# Patient Record
Sex: Female | Born: 1960 | Race: White | Hispanic: No | Marital: Married | Smoking: Never smoker
Health system: Southern US, Community
[De-identification: ages and names within clinical notes are randomized; demographics above are authoritative.]

## PROBLEM LIST (undated history)

## (undated) DIAGNOSIS — E669 Obesity, unspecified: Secondary | ICD-10-CM

## (undated) DIAGNOSIS — N809 Endometriosis, unspecified: Secondary | ICD-10-CM

## (undated) DIAGNOSIS — C50312 Malignant neoplasm of lower-inner quadrant of left female breast: Secondary | ICD-10-CM

## (undated) DIAGNOSIS — M79673 Pain in unspecified foot: Secondary | ICD-10-CM

## (undated) DIAGNOSIS — D649 Anemia, unspecified: Secondary | ICD-10-CM

## (undated) DIAGNOSIS — D219 Benign neoplasm of connective and other soft tissue, unspecified: Secondary | ICD-10-CM

## (undated) DIAGNOSIS — N979 Female infertility, unspecified: Secondary | ICD-10-CM

## (undated) DIAGNOSIS — Z923 Personal history of irradiation: Secondary | ICD-10-CM

## (undated) HISTORY — DX: Malignant neoplasm of lower-inner quadrant of left female breast: C50.312

## (undated) HISTORY — PX: ABDOMINAL HYSTERECTOMY: SHX81

## (undated) HISTORY — DX: Benign neoplasm of connective and other soft tissue, unspecified: D21.9

## (undated) HISTORY — PX: TONSILLECTOMY: SUR1361

## (undated) HISTORY — DX: Endometriosis, unspecified: N80.9

## (undated) HISTORY — DX: Female infertility, unspecified: N97.9

## (undated) HISTORY — PX: OTHER SURGICAL HISTORY: SHX169

## (undated) HISTORY — DX: Obesity, unspecified: E66.9

## (undated) HISTORY — PX: BREAST BIOPSY: SHX20

## (undated) HISTORY — PX: DILATION AND CURETTAGE OF UTERUS: SHX78

## (undated) HISTORY — DX: Pain in unspecified foot: M79.673

---

## 2014-04-29 ENCOUNTER — Other Ambulatory Visit (INDEPENDENT_AMBULATORY_CARE_PROVIDER_SITE_OTHER): Payer: BLUE CROSS/BLUE SHIELD

## 2014-04-29 ENCOUNTER — Ambulatory Visit (INDEPENDENT_AMBULATORY_CARE_PROVIDER_SITE_OTHER): Payer: BLUE CROSS/BLUE SHIELD | Admitting: Family Medicine

## 2014-04-29 ENCOUNTER — Encounter: Payer: Self-pay | Admitting: Family Medicine

## 2014-04-29 VITALS — BP 120/76 | HR 86 | Ht 66.0 in | Wt 180.0 lb

## 2014-04-29 DIAGNOSIS — M79671 Pain in right foot: Secondary | ICD-10-CM

## 2014-04-29 DIAGNOSIS — M779 Enthesopathy, unspecified: Secondary | ICD-10-CM

## 2014-04-29 DIAGNOSIS — M79672 Pain in left foot: Secondary | ICD-10-CM

## 2014-04-29 DIAGNOSIS — M7742 Metatarsalgia, left foot: Secondary | ICD-10-CM

## 2014-04-29 DIAGNOSIS — M7741 Metatarsalgia, right foot: Secondary | ICD-10-CM

## 2014-04-29 MED ORDER — PREDNISONE 50 MG PO TABS: 50.0000 mg | ORAL_TABLET | Freq: Every day | ORAL | Status: DC

## 2014-04-29 MED ORDER — HYDROXYZINE HCL 25 MG PO TABS: 25.0000 mg | ORAL_TABLET | Freq: Three times a day (TID) | ORAL | Status: DC | PRN

## 2014-04-29 NOTE — Patient Instructions (Addendum)
Very nice to meet you Ice bath 20 minutes at night.  Exercises 3 times a week.  Prednisone 50mg  5 days  Hydroxyzine to help you sleep Vitamin D 2000 IU daily.  Good shoes with rigid bottom.  Jalene Mullet, [place]rell or New balance greater then 700 See me again in 2 weeks.

## 2014-04-29 NOTE — Assessment & Plan Note (Signed)
I do believe with patient packing and moving recently she been on her feet a lot more often. I do think the patient has more of a capsulitis in the metatarsalgia occurring. We discussed proper shoe choices, icing protocol, and patient was given prednisone to take daily for the next 5 days. In addition of this we discussed other over-the-counter medications a could be beneficial. Patient did work with the Lobbyist today to learn home exercises in greater detail. Patient will try to do these and come back and see me again in 3-4 weeks. If patient continues to have difficulty we may need to consider injection. Patient may be a candidate for custom orthotics in the long run.

## 2014-04-29 NOTE — Progress Notes (Signed)
  [facility] [address], [place] [address] Phone: [phone] Subjective:     CC: Pain in feet.   [id number] Bethany Gardner is a 54 y.o. female coming in with complaint of pain in feet.  She states that she has had this recently over the course last several weeks. Patient has recently moved here and had been doing a significant amount of packing.   Patient states that since then when she stands on her toes this seems to be more painful. States that most the pain seems to be at the toes. Does have some soreness of the heel as well. Patient states that sometimes the first at the morning can be more painful. States that though activity can be painful as well. Patient hasn't tried some meloxicam which has been helpful. Has not done any other home modalities. Does not remember any true injury. Rates the severity of pain a 6 out of 10.    Past medical history, social, surgical and family history all reviewed in electronic medical record.   Review of Systems: No headache, visual changes, nausea, vomiting, diarrhea, constipation, dizziness, abdominal pain, skin rash, fevers, chills, night sweats, weight loss, swollen lymph nodes, body aches, joint swelling, muscle aches, chest pain, shortness of breath, mood changes.   Objective Blood pressure 120/76, pulse 86, height 5\' 6"  (1.676 m), weight 180 lb (81.647 kg), SpO2 97 %.  General: No apparent distress alert and oriented x3 mood and affect normal, dressed appropriately.  HEENT: Pupils equal, extraocular movements intact  Respiratory: Patient's speak in full sentences and does not appear short of breath  Cardiovascular: No lower extremity edema, non tender, no erythema  Skin: Warm dry intact with no signs of infection or rash on extremities or on axial skeleton.  Abdomen: Soft nontender  Neuro: Cranial nerves II through XII are intact, neurovascularly intact in all extremities with 2+ DTRs and 2+ pulses.    Lymph: No lymphadenopathy of posterior or anterior cervical chain or axillae bilaterally.  Gait normal with good balance and coordination.  MSK:  Non tender with full range of motion and good stability and symmetric strength and tone of shoulders, elbows, wrist, hip, knee and ankles bilaterally.  Foot exam shows the patient does have bilateral breakdown of the transverse arch bilaterally right greater than left. Patient is more tender to palpation over the right foot mostly over the first MTP joint. Patient otherwise has a fairly neutral foot with a very mild pes cavus bilaterally.  Limited musculoskeletal ultrasound was performed and interpreted by Hulan Saas, M  Limited ultrasound shows the patient does have what ears to be hypoechoic changes of multiple metatarsal joints across the right foot severely and somewhat on the left foot. No significant bony abnormality noted. No signs of a stress fracture. Mild hallux limitus with mild osteophytic changes. Plantar fascia measures 0.95 cm on the right foot. Impression: Capsulitis and metatarsalgia  Procedure note 82800; 15 minutes spent for Therapeutic exercises as stated in above notes.  This included exercises focusing on stretching, strengthening, with significant focus on eccentric aspects.   Proper technique shown and discussed handout in great detail with ATC.  All questions were discussed and answered.     Impression and Recommendations:     This case required medical decision making of moderate complexity.

## 2014-06-25 ENCOUNTER — Ambulatory Visit: Payer: Self-pay | Admitting: Family Medicine

## 2014-09-01 ENCOUNTER — Ambulatory Visit (INDEPENDENT_AMBULATORY_CARE_PROVIDER_SITE_OTHER): Payer: BLUE CROSS/BLUE SHIELD

## 2014-09-01 ENCOUNTER — Ambulatory Visit (INDEPENDENT_AMBULATORY_CARE_PROVIDER_SITE_OTHER): Payer: BLUE CROSS/BLUE SHIELD | Admitting: Podiatry

## 2014-09-01 ENCOUNTER — Encounter: Payer: Self-pay | Admitting: Podiatry

## 2014-09-01 VITALS — BP 120/70 | HR 70 | Resp 11 | Ht 66.0 in | Wt 185.0 lb

## 2014-09-01 DIAGNOSIS — M79671 Pain in right foot: Secondary | ICD-10-CM | POA: Diagnosis not present

## 2014-09-01 DIAGNOSIS — M779 Enthesopathy, unspecified: Secondary | ICD-10-CM

## 2014-09-01 DIAGNOSIS — M79672 Pain in left foot: Secondary | ICD-10-CM | POA: Diagnosis not present

## 2014-09-01 DIAGNOSIS — M722 Plantar fascial fibromatosis: Secondary | ICD-10-CM | POA: Diagnosis not present

## 2014-09-01 MED ORDER — MELOXICAM 15 MG PO TABS: 15.0000 mg | ORAL_TABLET | Freq: Every day | ORAL | Status: DC

## 2014-09-01 NOTE — Progress Notes (Signed)
   Subjective:    Patient ID: Bethany Gardner, female    DOB: 06/27/1960, 54 y.o.   MRN: [id number]  HPI    Review of Systems  Constitutional: Positive for activity change and unexpected weight change.       Inactivity due to foot pain  All other systems reviewed and are negative.      Objective:   Physical Exam        Assessment & Plan:

## 2014-09-01 NOTE — Progress Notes (Signed)
Subjective:     Patient ID: Bethany Gardner, female   DOB: Jan 20, 1961, 54 y.o.   MRN: [id number]  HPI patient presents stating I have had this excruciating pain in my feet for the last 6 months. I was moving from a home in [place] to hear and I was on my feet a lot between the 2 areas and the pain started in his never gotten better. I try to reduce activity without success and I have tried to lose weight   Review of Systems  All other systems reviewed and are negative.      Objective:   Physical Exam  Constitutional: She is oriented to person, place, and time.  Cardiovascular: Intact distal pulses.   Musculoskeletal: Normal range of motion.  Neurological: She is oriented to person, place, and time.  Skin: Skin is warm.  Nursing note and vitals reviewed.  neurovascular status intact with muscle strength adequate range of motion within normal limits. Patient's noted to have moderate discomfort in the metatarsophalangeal joints of both feet within the arch and the plantar fascia but no specific area of intense discomfort. She states it does tend to be worse at the end of the day after she has been her on her foot. 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 I dispensed metatarsal pads with instructions on usage and night splints in order to stretch along with aggressive heat and ice therapy. I placed on meloxicam 15 mg daily and we'll reevaluate again in 2 weeks. Discussed long-term she will require orthotics

## 2014-09-10 ENCOUNTER — Encounter: Payer: Self-pay | Admitting: Family Medicine

## 2014-09-10 ENCOUNTER — Ambulatory Visit (INDEPENDENT_AMBULATORY_CARE_PROVIDER_SITE_OTHER): Payer: BLUE CROSS/BLUE SHIELD | Admitting: Family Medicine

## 2014-09-10 VITALS — BP 102/80 | HR 78 | Temp 97.7°F | Ht 65.0 in | Wt 194.8 lb

## 2014-09-10 DIAGNOSIS — M79673 Pain in unspecified foot: Secondary | ICD-10-CM | POA: Diagnosis not present

## 2014-09-10 DIAGNOSIS — Z6832 Body mass index (BMI) 32.0-32.9, adult: Secondary | ICD-10-CM

## 2014-09-10 DIAGNOSIS — Z7689 Persons encountering health services in other specified circumstances: Secondary | ICD-10-CM

## 2014-09-10 DIAGNOSIS — Z7189 Other specified counseling: Secondary | ICD-10-CM | POA: Diagnosis not present

## 2014-09-10 DIAGNOSIS — Z1211 Encounter for screening for malignant neoplasm of colon: Secondary | ICD-10-CM

## 2014-09-10 NOTE — Progress Notes (Signed)
HPI:  Bethany Gardner is here to establish care. Recently moved from [place]. She wants to schedule her mammogram, hx of benign breast lump with biopsy > 10 years ago. S/p hysterectomy, reports still has ovaries. Reports has mild Hyperlipidemia but her ratio was good. Labs done 1.5 years ago. She wants a referral for a colonoscopy - never did this. No sig FH. No change in bowels  Has the following chronic problems that require follow up and concerns today:  Foot pain: -bilat, started after moving in January 2016 -seeing sports medicine and podiatry -taking meloxicam - and this seems to be helping  Obesity: -gained weight over the last few years; just started back on exercising -diet: ok, but not great - did loose weight with a low carb diet in the pas-reports has had labs in last year and all ok including TSH   ROS negative for unless reported above: fevers, unintentional weight loss, hearing or vision loss, chest pain, palpitations, struggling to breath, hemoptysis, melena, hematochezia, hematuria, falls, loc, si, thoughts of self harm  Past Medical History  Diagnosis Date  . Foot pain   . Obesity     Past Surgical History  Procedure Laterality Date  . Abdominal hysterectomy    . Laproscopy      three  . Tonsillectomy    . Breast biopsy      benign    Family History  Problem Relation Age of Onset  . Heart disease Father   . Heart attack Father 33  . Transient ischemic attack Mother   . Breast cancer Maternal Grandmother     History   Social History  . Marital Status: Married    Spouse Name: N/A  . Number of Children: N/A  . Years of Education: N/A   Social History Main Topics  . Smoking status: Never Smoker   . Smokeless tobacco: Not on file  . Alcohol Use: 0.0 oz/week    0 Standard drinks or equivalent per week     Comment: 1 glass of wine nightly  . Drug Use: Not on file  . Sexual Activity: Not on file   Other Topics Concern  . None   Social History  Narrative   Work or School: homemaker      Home Situation: lives with husband and son in [place] (also has a daughter)      Spiritual Beliefs:  Christian      Lifestyle: restarting a healthy diet and exercise in 2016           Current outpatient prescriptions:  .  meloxicam (MOBIC) 15 MG tablet, Take 1 tablet (15 mg total) by mouth daily., Disp: 30 tablet, Rfl: 2  EXAM:  Filed Vitals:   09/10/14 0815  BP: 102/80  Pulse: 78  Temp: 97.7 F (36.5 C)    Body mass index is 32.42 kg/(m^2).  GENERAL: vitals reviewed and listed above, alert, oriented, appears well hydrated and in no acute distress  HEENT: atraumatic, conjunttiva clear, no obvious abnormalities on inspection of external nose and ears  NECK: no obvious masses on inspection  LUNGS: clear to auscultation bilaterally, no wheezes, rales or rhonchi, good air movement  CV: HRRR, no peripheral edema  MS: moves all extremities without noticeable abnormality  PSYCH: pleasant and cooperative, no obvious depression or anxiety  ASSESSMENT AND PLAN:  Discussed the following assessment and plan:  Foot pain, unspecified laterality  BMI 32.0-32.9,adult  Colon cancer screening - Plan: Ambulatory referral to Gastroenterology  Encounter to  establish care -We reviewed the PMH, PSH, FH, SH, Meds and Allergies. -We provided refills for any medications we will prescribe as needed. -We addressed current concerns per orders and patient instructions. -We have asked for records for pertinent exams, studies, vaccines and notes from previous providers. -We have advised patient to follow up per instructions below. -lifestyle recs- advised of lifestyle, pharmacological and surgical options for weight loss -discussed risks benefits of nsaids, foot wear, non weight bearing options for exercise -mammo -referred for colonoscopy -physical with labs in 3 months   -Patient advised to return or notify a doctor immediately if  symptoms worsen or persist or new concerns arise.  Patient Instructions  BEFORE YOU LEAVE: -schedule physical in 3-4 months, come fasting  -schedule your mammogram  -We placed a referral for you as discussed for the colonoscopy. It usually takes about 1-2 weeks to process and schedule this referral. If you have not heard from [place] regarding this appointment in 2 weeks please contact our office.  We recommend the following healthy lifestyle measures: - eat a healthy diet consisting of lots of vegetables, fruits, beans, nuts, seeds, healthy meats such as white chicken and fish.  - avoid fried foods, starches, sweet, fast food, processed foods, sodas, red meet and other fattening foods.  - get a least 150 minutes of aerobic exercise per week.        Colin Benton R.

## 2014-09-10 NOTE — Patient Instructions (Signed)
BEFORE YOU LEAVE: -schedule physical in 3-4 months, come fasting  -schedule your mammogram  -We placed a referral for you as discussed for the colonoscopy. It usually takes about 1-2 weeks to process and schedule this referral. If you have not heard from [place] regarding this appointment in 2 weeks please contact our office.  We recommend the following healthy lifestyle measures: - eat a healthy diet consisting of lots of vegetables, fruits, beans, nuts, seeds, healthy meats such as white chicken and fish.  - avoid fried foods, starches, sweet, fast food, processed foods, sodas, red meet and other fattening foods.  - get a least 150 minutes of aerobic exercise per week.

## 2014-09-10 NOTE — Progress Notes (Signed)
Pre visit review using our clinic review tool, if applicable. No additional management support is needed unless otherwise documented below in the visit note. 

## 2014-09-15 ENCOUNTER — Ambulatory Visit: Payer: BLUE CROSS/BLUE SHIELD | Admitting: Podiatry

## 2014-10-10 ENCOUNTER — Encounter: Payer: Self-pay | Admitting: Internal Medicine

## 2014-10-14 ENCOUNTER — Other Ambulatory Visit: Payer: Self-pay

## 2014-10-14 DIAGNOSIS — Z1231 Encounter for screening mammogram for malignant neoplasm of breast: Secondary | ICD-10-CM

## 2014-10-28 ENCOUNTER — Ambulatory Visit (INDEPENDENT_AMBULATORY_CARE_PROVIDER_SITE_OTHER): Payer: Self-pay | Admitting: Family Medicine

## 2014-10-28 DIAGNOSIS — R69 Illness, unspecified: Secondary | ICD-10-CM

## 2014-10-28 NOTE — Progress Notes (Signed)
No show

## 2014-11-14 ENCOUNTER — Ambulatory Visit
Admission: RE | Admit: 2014-11-14 | Discharge: 2014-11-14 | Disposition: A | Discharge status: Home / Self Care | Payer: BLUE CROSS/BLUE SHIELD | Source: Ambulatory Visit

## 2014-11-14 DIAGNOSIS — Z1231 Encounter for screening mammogram for malignant neoplasm of breast: Secondary | ICD-10-CM

## 2015-01-05 ENCOUNTER — Encounter: Payer: Self-pay | Admitting: Family Medicine

## 2015-01-05 ENCOUNTER — Ambulatory Visit (INDEPENDENT_AMBULATORY_CARE_PROVIDER_SITE_OTHER): Payer: BLUE CROSS/BLUE SHIELD | Admitting: Family Medicine

## 2015-01-05 VITALS — BP 102/78 | HR 83 | Temp 98.0°F | Ht 65.0 in

## 2015-01-05 DIAGNOSIS — Z808 Family history of malignant neoplasm of other organs or systems: Secondary | ICD-10-CM

## 2015-01-05 DIAGNOSIS — R102 Pelvic and perineal pain unspecified side: Secondary | ICD-10-CM

## 2015-01-05 DIAGNOSIS — Z23 Encounter for immunization: Secondary | ICD-10-CM

## 2015-01-05 DIAGNOSIS — M79673 Pain in unspecified foot: Secondary | ICD-10-CM

## 2015-01-05 DIAGNOSIS — H8110 Benign paroxysmal vertigo, unspecified ear: Secondary | ICD-10-CM | POA: Diagnosis not present

## 2015-01-05 DIAGNOSIS — Z1211 Encounter for screening for malignant neoplasm of colon: Secondary | ICD-10-CM

## 2015-01-05 DIAGNOSIS — Z0000 Encounter for general adult medical examination without abnormal findings: Secondary | ICD-10-CM

## 2015-01-05 DIAGNOSIS — M549 Dorsalgia, unspecified: Secondary | ICD-10-CM | POA: Diagnosis not present

## 2015-01-05 LAB — LIPID PANEL
CHOL/HDL RATIO: 3
Cholesterol: 218 mg/dL — ABNORMAL HIGH (ref 0–200)
HDL: 68 mg/dL (ref >39.00)
LDL CALC: 133 mg/dL — AB (ref 0–99)
NONHDL: 149.83
TRIGLYCERIDES: 84 mg/dL (ref 0.0–149.0)
VLDL: 16.8 mg/dL (ref 0.0–40.0)

## 2015-01-05 LAB — HEMOGLOBIN A1C: Hgb A1c MFr Bld: 5.3 % (ref 4.6–6.5)

## 2015-01-05 LAB — TSH: TSH: 1.54 u[IU]/mL (ref 0.35–4.50)

## 2015-01-05 MED ORDER — MECLIZINE HCL 25 MG PO TABS: 25.0000 mg | ORAL_TABLET | Freq: Three times a day (TID) | ORAL | Status: DC | PRN

## 2015-01-05 NOTE — Progress Notes (Signed)
Patient declines weight measurement today. 

## 2015-01-05 NOTE — Progress Notes (Signed)
Pre visit review using our clinic review tool, if applicable. No additional management support is needed unless otherwise documented below in the visit note. 

## 2015-01-05 NOTE — Progress Notes (Signed)
HPI:  Here for CPE:  -Concerns and/or follow up today: many  1)low back pelvic pain: -about once per year for > five years -moderate to severe low back and pelvic pain lasting 1-2 weeks then resolves on its own -denies: weakness, numbness, nvd, change in bowels, bowel or bladder dysfunction, weight loss -reports eval with gyn several times remotely as she is very concerned about her ovaries and told normal, wants to see gyn here  2)chronic foot pain: -seeing podiatry and has seen several and is not happy with results -thinks has diabetic neuropathy -denies: weakness, numbness, worsening  3)Intermittnet chronic vertigo: -reports eval remotely and dx positional vertigo -flares triggered by position, certain turning of the head -wants meclizine which she has used in the past -reports never did vestibular rehab  4)Obesity: -Diet: variety of foods, balance and well rounded, larger portion sizes -Exercise: no regular exercise  -Taking folic acid, vitamin D or calcium: no  -Diabetes and Dyslipidemia Screening: FASTING today  -Hx of HTN: no  -Vaccines: agreed to flu, declined tdap  -pap history: s/p hysterectomy  -FDLMP: n/a  -sexual activity: yes, female partner, no new partners  -wants STI testing (Hep C if born [birth year]): no  -FH breast, colon or ovarian ca: see FH Last mammogram:done this year Last colon cancer screening: not done  She wants to see gyn for breast health   -Alcohol, Tobacco, drug use: see social history  Review of Systems - no fevers, unintentional weight loss, vision loss, hearing loss, chest pain, sob, hemoptysis, melena, hematochezia, hematuria, genital discharge, changing or concerning skin lesions, bleeding, bruising, loc, thoughts of self harm or SI  Past Medical History  Diagnosis Date  . Foot pain   . Obesity     Past Surgical History  Procedure Laterality Date  . Abdominal hysterectomy    . Laproscopy      three  . Tonsillectomy     . Breast biopsy      benign    Family History  Problem Relation Age of Onset  . Heart disease Father   . Heart attack Father 7  . Transient ischemic attack Mother   . Breast cancer Maternal Grandmother     Social History   Social History  . Marital Status: Married    Spouse Name: N/A  . Number of Children: N/A  . Years of Education: N/A   Social History Main Topics  . Smoking status: Never Smoker   . Smokeless tobacco: None  . Alcohol Use: 0.0 oz/week    0 Standard drinks or equivalent per week     Comment: 1 glass of wine nightly  . Drug Use: None  . Sexual Activity: Not Asked   Other Topics Concern  . None   Social History Narrative   Work or School: homemaker      Home Situation: lives with husband and son in [place] (also has a daughter)      Spiritual Beliefs:  Christian      Lifestyle: restarting a healthy diet and exercise in 2016           Current outpatient prescriptions:  .  meloxicam (MOBIC) 15 MG tablet, Take 1 tablet (15 mg total) by mouth daily., Disp: 30 tablet, Rfl: 2 .  meclizine (ANTIVERT) 25 MG tablet, Take 1 tablet (25 mg total) by mouth 3 (three) times daily as needed for dizziness., Disp: 30 tablet, Rfl: 0  EXAM:  Filed Vitals:   01/05/15 0935  BP: 102/78  Pulse: 83  Temp: 98 F (36.7 C)    GENERAL: vitals reviewed and listed below, alert, oriented, appears well hydrated and in no acute distress  HEENT: head atraumatic, PERRLA, normal appearance of eyes, ears, nose and mouth. moist mucus membranes.  NECK: supple, no masses or lymphadenopathy  LUNGS: clear to auscultation bilaterally, no rales, rhonchi or wheeze  CV: HRRR, no peripheral edema or cyanosis, normal pedal pulses  BREAST: declined  ABDOMEN: bowel sounds normal, soft, non tender to palpation, no masses, no rebound or guarding  GU: declined  SKIN: no rash or abnormal lesions  MS: normal gait, moves all extremities normally  NEURO: CN II-XII grossly  intact, normal muscle strength and sensation to light touch on extremities  PSYCH: normal affect, pleasant and cooperative  ASSESSMENT AND PLAN:  Discussed the following assessment and plan: NOTE: sig time, work and evaluation beyond normal physical for multiple new complaints to me and chronic issues with new labs, referrals and medications prescribed.  Visit for preventive health examination - Plan: Lipid Panel, Hemoglobin A1c, TSH -Discussed and advised all [place] preventive services health task force level A and B recommendations for age, sex and risks. -offered pelvic and breast exam after discussion limitations - deferred - she plans to see gyn -flu vaccine given -Advised at least 150 minutes of exercise per week and a healthy diet low in saturated fats and sweets and consisting of fresh fruits and vegetables, lean meats such as fish and white chicken and whole grains. -FASTING labs, studies and vaccines per orders this encounter  Bilateral back pain, unspecified location Pelvic pain in female -infrequent, rare occurences for > 5 years - unlikely serious or neoplastic process -we discussed possible serious and likely etiologies, workup and treatment, treatment risks and return precautions - DDD with radicular symptoms vs IBS most likely -after this discussion, Bethany Gardner opted for colonoscopy, xrays, low back HEP, gyn eval for her concerns regarding this being her ovaries (has had neg eval in the past), I advised CT if persists despite this eval, worsening or persistent concerns and discussed risks -follow up advised as needed -of course, we advised Bethany Gardner  to return or notify a doctor immediately if symptoms worsen or persist or new concerns arise.  Colon cancer screening - Plan: Ambulatory referral to Colorectal Surgery  FH: melanoma -advised at least qy skin check with derm and provided number to call to set up; advised she call today to schedule eval  Benign paroxysmal positional  vertigo, unspecified laterality -likely given hx, discussed other causes of vertigo -refilled meclizine and advised vestibular rehab, advise MRI if persistent or new  Symptoms  Foot pain, unspecified laterality - Plan: Methylmalonic Acid, Serum -advised follow up with her podiatrist, topical treatments as she fails all medical recs have failed her from multiple specialists -check tsh and b12    Orders Placed This Encounter  Procedures  . Lipid Panel  . Hemoglobin A1c  . TSH  . Methylmalonic Acid, Serum  . Ambulatory referral to Colorectal Surgery    Referral Priority:  Routine    Referral Type:  Surgical    Referral Reason:  Specialty Services Required    Requested Specialty:  Gastroenterology    Number of Visits Requested:  1    Patient advised to return to clinic immediately if symptoms worsen or persist or new concerns.  Patient Instructions  BEFORE YOU LEAVE: -labs -pneumonia vaccine -xray sheet -low back exercises  Call today to schedule a yearly dermatology skin exam - numbers provided  We placed a referral for you as discussed for your colonoscopy and to the vestibular rehab therapist for the vertigo. It usually takes about 1-2 weeks to process and schedule this referral. If you have not heard from [place] regarding this appointment in 2 weeks please contact our office.  Please call if persistent vertigo in 1 month after treatment  For the back and abdominal pain: -get the xrays of your back -do the exercises 3x per week -call to schedule appointment with gyn for annual gyn exam -get colonoscopy -follow up as needed  For the feet: -try topical menthol (tiger balm) or other sports creams -follow up with your foot doctor  We recommend the following healthy lifestyle measures: - eat a healthy diet consisting of small portions of vegetables, fruits, beans, nuts, seeds, healthy meats such as white chicken and fish and whole grains.  - avoid sweets, white starches,  fried foods, fast food, processed foods, sodas, red meet and other fattening foods.  - get a least 150 minutes of aerobic exercise per week.        No Follow-up on file.  Colin Benton R.

## 2015-01-05 NOTE — Patient Instructions (Addendum)
BEFORE YOU LEAVE: -labs -pneumonia vaccine -xray sheet -low back exercises -Follow up in 3 months  Call today to schedule a yearly dermatology skin exam - numbers provided  We placed a referral for you as discussed for your colonoscopy and to the vestibular rehab therapist for the vertigo. It usually takes about 1-2 weeks to process and schedule this referral. If you have not heard from [place] regarding this appointment in 2 weeks please contact our office.  Please call if persistent vertigo in 1 month after treatment  For the back and abdominal pain: -get the xrays of your back -do the exercises 3x per week -call to schedule appointment with gyn for annual gyn exam -get colonoscopy -follow up as needed  For the feet: -try topical menthol (tiger balm) or other sports creams -follow up with your foot doctor  We recommend the following healthy lifestyle measures: - eat a healthy diet consisting of small portions of vegetables, fruits, beans, nuts, seeds, healthy meats such as white chicken and fish and whole grains.  - avoid sweets, white starches, fried foods, fast food, processed foods, sodas, red meet and other fattening foods.  - get a least 150 minutes of aerobic exercise per week.

## 2015-01-07 LAB — METHYLMALONIC ACID, SERUM: Methylmalonic Acid, Quant: 131 nmol/L (ref 87–318)

## 2015-01-08 ENCOUNTER — Encounter: Payer: Self-pay | Admitting: Gastroenterology

## 2015-01-26 ENCOUNTER — Ambulatory Visit (AMBULATORY_SURGERY_CENTER): Payer: Self-pay | Admitting: *Deleted

## 2015-01-26 ENCOUNTER — Ambulatory Visit (INDEPENDENT_AMBULATORY_CARE_PROVIDER_SITE_OTHER)
Admission: RE | Admit: 2015-01-26 | Discharge: 2015-01-26 | Disposition: A | Discharge status: Home / Self Care | Payer: BLUE CROSS/BLUE SHIELD | Source: Ambulatory Visit | Attending: Family Medicine | Admitting: Family Medicine

## 2015-01-26 ENCOUNTER — Other Ambulatory Visit: Payer: Self-pay | Admitting: *Deleted

## 2015-01-26 VITALS — Ht 66.0 in | Wt 200.0 lb

## 2015-01-26 DIAGNOSIS — M545 Low back pain: Secondary | ICD-10-CM

## 2015-01-26 DIAGNOSIS — Z1211 Encounter for screening for malignant neoplasm of colon: Secondary | ICD-10-CM

## 2015-01-26 MED ORDER — SUPREP BOWEL PREP KIT 17.5-3.13-1.6 GM/177ML PO SOLN: 1.0000 | Freq: Once | ORAL | Status: DC

## 2015-01-26 NOTE — Progress Notes (Signed)
Patient denies any allergies to egg or soy products. Patient denies complications with anesthesia/sedation.  Patient denies oxygen use at home and denies diet medications. Emmi instructions for colonoscopy explained and denied.

## 2015-02-05 ENCOUNTER — Encounter: Payer: Self-pay | Admitting: Gastroenterology

## 2015-02-05 ENCOUNTER — Ambulatory Visit (AMBULATORY_SURGERY_CENTER): Payer: BLUE CROSS/BLUE SHIELD | Admitting: Gastroenterology

## 2015-02-05 VITALS — BP 97/55 | HR 62 | Temp 98.2°F | Resp 20 | Ht 66.0 in | Wt 200.0 lb

## 2015-02-05 DIAGNOSIS — Z1211 Encounter for screening for malignant neoplasm of colon: Secondary | ICD-10-CM

## 2015-02-05 DIAGNOSIS — K621 Rectal polyp: Secondary | ICD-10-CM

## 2015-02-05 DIAGNOSIS — D128 Benign neoplasm of rectum: Secondary | ICD-10-CM

## 2015-02-05 DIAGNOSIS — D123 Benign neoplasm of transverse colon: Secondary | ICD-10-CM

## 2015-02-05 MED ORDER — SODIUM CHLORIDE 0.9 % IV SOLN: 500.0000 mL | INTRAVENOUS | Status: DC

## 2015-02-05 NOTE — Progress Notes (Signed)
Called to room to assist during endoscopic procedure.  Patient ID and intended procedure confirmed with present staff. Received instructions for my participation in the procedure from the performing physician.  

## 2015-02-05 NOTE — Op Note (Signed)
[place]  [address] [place], [id number]   COLONOSCOPY PROCEDURE REPORT  PATIENT: Bethany Gardner, Bethany Gardner  MR#: [id number] BIRTHDATE: [birth year] , 59  yrs. old GENDER: female ENDOSCOPIST: Yetta Flock, MD REFERRED BY: Colin [place] DO PROCEDURE DATE:  02/05/2015 PROCEDURE:   Colonoscopy, screening, Colonoscopy with snare polypectomy, and Colonoscopy with biopsy First Screening Colonoscopy - Avg.  risk and is 50 yrs.  old or older Yes.  Prior Negative Screening - Now for repeat screening. N/A  History of Adenoma - Now for follow-up colonoscopy & has been > or = to 3 yrs.  N/A  Polyps removed today? Yes ASA CLASS:   Class II INDICATIONS:Screening for colonic neoplasia and Colorectal Neoplasm Risk Assessment for this procedure is average risk. MEDICATIONS: Propofol 300 mg IV  DESCRIPTION OF PROCEDURE:   After the risks benefits and alternatives of the procedure were thoroughly explained, informed consent was obtained.  The digital rectal exam revealed no abnormalities of the rectum.   The LB [id number]  endoscope was introduced through the anus and advanced to the cecum, which was identified by both the appendix and ileocecal valve. No adverse events experienced.   The quality of the prep was good.  The instrument was then slowly withdrawn as the colon was fully examined. Estimated blood loss is zero unless otherwise noted in this procedure report.  COLON FINDINGS: A flat polyp measuring 8 mm in size was found in the transverse colon.  A polypectomy was performed with a cold snare. The resection was complete, the polyp tissue was completely retrieved and sent to histology.   A sessile polyp measuring 3 mm in size was found in the rectum.  A polypectomy was performed with cold forceps.  The resection was complete, the polyp tissue was completely retrieved and sent to histology.   The examination was otherwise normal.  Retroflexed views revealed internal  hemorrhoids. The time to cecum = 3.3 Withdrawal time = 17.1   The scope was withdrawn and the procedure completed. COMPLICATIONS: There were no immediate complications.  ENDOSCOPIC IMPRESSION: 1.   Flat polyp was found in the transverse colon; polypectomy was performed with a cold snare 2.   Sessile polyp was found in the rectum; polypectomy was performed with cold forceps 3.   The examination was otherwise normal  RECOMMENDATIONS: 1.  Await pathology results 2.  No NSAIDs for 2 weeks 3.  Resume diet 4.  Resume medications  eSigned:  Yetta Flock, MD 02/05/2015 2:47 PM   cc: Colin [place] DO, the patient

## 2015-02-05 NOTE — Patient Instructions (Signed)
Impressions/recommendations:  Polyps (handout given)  Do not take aspirin, aspirin containing products or NSAIDS for 2 weeks, may resume 11/11.  Repeat colonoscopy pending pathology results.  YOU HAD AN ENDOSCOPIC PROCEDURE TODAY AT [facility]:   Refer to the procedure report that was given to you for any specific questions about what was found during the examination.  If the procedure report does not answer your questions, please call your gastroenterologist to clarify.  If you requested that your care partner not be given the details of your procedure findings, then the procedure report has been included in a sealed envelope for you to review at your convenience later.  YOU SHOULD EXPECT: Some feelings of bloating in the abdomen. Passage of more gas than usual.  Walking can help get rid of the air that was put into your GI tract during the procedure and reduce the bloating. If you had a lower endoscopy (such as a colonoscopy or flexible sigmoidoscopy) you may notice spotting of blood in your stool or on the toilet paper. If you underwent a bowel prep for your procedure, you may not have a normal bowel movement for a few days.  Please Note:  You might notice some irritation and congestion in your nose or some drainage.  This is from the oxygen used during your procedure.  There is no need for concern and it should clear up in a day or so.  SYMPTOMS TO REPORT IMMEDIATELY:   Following lower endoscopy (colonoscopy or flexible sigmoidoscopy):  Excessive amounts of blood in the stool  Significant tenderness or worsening of abdominal pains  Swelling of the abdomen that is new, acute  Fever of 100F or higher   For urgent or emergent issues, a gastroenterologist can be reached at any hour by calling [phone].   DIET: Your first meal following the procedure should be a small meal and then it is ok to progress to your normal diet. Heavy or fried foods are harder to digest  and may make you feel nauseous or bloated.  Likewise, meals heavy in dairy and vegetables can increase bloating.  Drink plenty of fluids but you should avoid alcoholic beverages for 24 hours.  ACTIVITY:  You should plan to take it easy for the rest of today and you should NOT DRIVE or use heavy machinery until tomorrow (because of the sedation medicines used during the test).    FOLLOW UP: Our staff will call the number listed on your records the next business day following your procedure to check on you and address any questions or concerns that you may have regarding the information given to you following your procedure. If we do not reach you, we will leave a message.  However, if you are feeling well and you are not experiencing any problems, there is no need to return our call.  We will assume that you have returned to your regular daily activities without incident.  If any biopsies were taken you will be contacted by phone or by letter within the next 1-3 weeks.  Please call us at [phone] if you have not heard about the biopsies in 3 weeks.    SIGNATURES/CONFIDENTIALITY: You and/or your care partner have signed paperwork which will be entered into your electronic medical record.  These signatures attest to the fact that that the information above on your After Visit Summary has been reviewed and is understood.  Full responsibility of the confidentiality of this discharge information lies with you  and/or your care-partner. 

## 2015-02-05 NOTE — Progress Notes (Signed)
To Pacu-pt awake and alert. Please with MAC. Report to RN

## 2015-02-05 NOTE — Progress Notes (Signed)
Patient states IV site very tender. Stated it was "thumped" on a lot prior to starting IV. Information given of being a tender spot to start an IV, warm pack placed and encouraged not to hold it stiffly, use it as normal. Asked her to phone [place] if not improved by middle of next week.

## 2015-02-06 ENCOUNTER — Telehealth: Payer: Self-pay | Admitting: *Deleted

## 2015-02-06 NOTE — Telephone Encounter (Signed)
  Follow up Call-  Call back number 02/05/2015  Post procedure Call Back phone  # [phone]  Permission to leave phone message Yes     Patient questions:  Message left to call if necessary.

## 2015-02-11 ENCOUNTER — Encounter: Payer: Self-pay | Admitting: Gastroenterology

## 2015-03-25 ENCOUNTER — Ambulatory Visit: Payer: Self-pay | Admitting: Obstetrics and Gynecology

## 2015-04-15 ENCOUNTER — Telehealth: Payer: Self-pay | Admitting: Obstetrics and Gynecology

## 2015-04-15 ENCOUNTER — Ambulatory Visit (INDEPENDENT_AMBULATORY_CARE_PROVIDER_SITE_OTHER): Payer: Managed Care, Other (non HMO) | Admitting: Obstetrics and Gynecology

## 2015-04-15 ENCOUNTER — Encounter: Payer: Self-pay | Admitting: Obstetrics and Gynecology

## 2015-04-15 VITALS — BP 118/72 | HR 84 | Resp 16 | Ht 65.25 in | Wt 195.0 lb

## 2015-04-15 DIAGNOSIS — Z0000 Encounter for general adult medical examination without abnormal findings: Secondary | ICD-10-CM

## 2015-04-15 DIAGNOSIS — Z01419 Encounter for gynecological examination (general) (routine) without abnormal findings: Secondary | ICD-10-CM

## 2015-04-15 DIAGNOSIS — Z780 Asymptomatic menopausal state: Secondary | ICD-10-CM

## 2015-04-15 DIAGNOSIS — M79672 Pain in left foot: Principal | ICD-10-CM

## 2015-04-15 DIAGNOSIS — N941 Unspecified dyspareunia: Secondary | ICD-10-CM | POA: Diagnosis not present

## 2015-04-15 DIAGNOSIS — M792 Neuralgia and neuritis, unspecified: Secondary | ICD-10-CM

## 2015-04-15 DIAGNOSIS — R1030 Lower abdominal pain, unspecified: Secondary | ICD-10-CM | POA: Diagnosis not present

## 2015-04-15 DIAGNOSIS — N952 Postmenopausal atrophic vaginitis: Secondary | ICD-10-CM

## 2015-04-15 DIAGNOSIS — M79671 Pain in right foot: Secondary | ICD-10-CM

## 2015-04-15 LAB — FOLLICLE STIMULATING HORMONE: FSH: 100.9 m[IU]/mL

## 2015-04-15 MED ORDER — ESTRADIOL 10 MCG VA TABS: ORAL_TABLET | VAGINAL | Status: DC

## 2015-04-15 NOTE — Patient Instructions (Addendum)

## 2015-04-15 NOTE — Telephone Encounter (Signed)
Patient was seen today and is asking for a recommendation to a foot doctor.

## 2015-04-15 NOTE — Progress Notes (Signed)
Patient ID: Bethany Gardner, female   DOB: 16-Dec-1960, 55 y.o.   MRN: [id number] 55 y.o. [id number] MarriedCaucasianF here for annual exam. Patient c/o pelvic and back pain. She is also c/o painful intercourse and vaginal dryness.    She had a laparoscopic hysterectomy 6 years ago, still has her ovaries. H/O endometriosis, menorrhagia and dysmenorrhea. She c/o intermittent severe pelvic pain for the last 5 years. Pain is from her lower abdomen to lower back, it's severe. The pain is sporadic and random. She can go up to 6 months without the pain. Last episode 3 months ago lasted x 2 months. Nothing makes it better or worse. Flaring slightly now. The pain feels vice like.  She denies hot flashes or night sweats. She does have vaginal dryness, dyspareunia. Feels like she is tearing.  She uses rectal suppository every morning to have a BM (fleets), she has been doing this long term. She can't have a BM without it. Normal colonoscopy a month ago.  No urinary c/o. She has terrible foot pain, she has gained a lot of weight. Both feet have needle/burning pain. Normal glucose testing at the pharmacy, had blood work with her primary.     No LMP recorded. Patient has had a hysterectomy.          Sexually active: Yes.    The current method of family planning is status post hysterectomy.    Exercising: No.  The patient does not participate in regular exercise at present. She used to be a marathon runner. Smoker:  no  Health Maintenance: Pap:  unsure History of abnormal Pap:  no MMG:  11-14-14 WNL  Colonoscopy:  02-05-15 polyps repeat in 5 years  BMD:   Never TDaP:  02-2015 Gardasil: N/A   reports that she has never smoked. She has never used smokeless tobacco. She reports that she drinks about 4.2 oz of alcohol per week. She reports that she does not use illicit drugs. She is a homemaker, she has a Equities trader in high school and a grown pregnant daughter (out of town). She is an Training and development officer, h/o Community education officer.   Past  Medical History  Diagnosis Date  . Foot pain     bilateral - occ foot pain  . Obesity   . Endometriosis   . Fibroid   . Infertility, female   1 NSVD, 1 adopted child.  Past Surgical History  Procedure Laterality Date  . Abdominal hysterectomy    . Laproscopy      three  . Tonsillectomy    . Breast biopsy Left     benign  . Dilation and curettage of uterus      Current Outpatient Prescriptions  Medication Sig Dispense Refill  . ibuprofen (ADVIL,MOTRIN) 100 MG tablet Take 200 mg by mouth at bedtime.    . meloxicam (MOBIC) 15 MG tablet Take 1 tablet (15 mg total) by mouth daily. 30 tablet 2   No current facility-administered medications for this visit.    Family History  Problem Relation Age of Onset  . Heart disease Father   . Heart attack Father 66  . Transient ischemic attack Mother   . Stroke Mother   . Breast cancer Maternal Grandmother   . Colon cancer Neg Hx   . Colon polyps Neg Hx   . Esophageal cancer Neg Hx   . Rectal cancer Neg Hx   . Stomach cancer Neg Hx   . Melanoma Sister     Review of Systems  Constitutional: Positive for  unexpected weight change.  HENT: Negative.   Eyes: Negative.   Respiratory: Negative.   Cardiovascular: Negative.   Gastrointestinal: Negative.   Endocrine: Negative.   Genitourinary: Positive for pelvic pain and dyspareunia.       Vaginal dryness  Musculoskeletal: Positive for back pain.  Skin: Negative.   Allergic/Immunologic: Negative.   Neurological: Negative.   Psychiatric/Behavioral: Negative.     Exam:   BP 118/72 mmHg  Pulse 84  Resp 16  Ht 5' 5.25" (1.657 m)  Wt 195 lb (88.451 kg)  BMI 32.21 kg/m2  Weight change: @WEIGHTCHANGE @ Height:   Height: 5' 5.25" (165.7 cm)  Ht Readings from Last 3 Encounters:  04/15/15 5' 5.25" (1.657 m)  02/05/15 5\' 6"  (1.676 m)  01/26/15 5\' 6"  (1.676 m)    General appearance: alert, cooperative and appears stated age Head: Normocephalic, without obvious abnormality,  atraumatic Neck: no adenopathy, supple, symmetrical, trachea midline and thyroid normal to inspection and palpation Lungs: clear to auscultation bilaterally Breasts: normal appearance, no masses or tenderness Heart: regular rate and rhythm Abdomen: soft, non-tender; bowel sounds normal; no masses,  no organomegaly Extremities: extremities normal, atraumatic, no cyanosis or edema Skin: Skin color, texture, turgor normal. No rashes or lesions Lymph nodes: Cervical, supraclavicular, and axillary nodes normal. No abnormal inguinal nodes palpated Neurologic: Grossly normal   Pelvic: External genitalia:  no lesions              Urethra:  normal appearing urethra with no masses, tenderness or lesions              Bartholins and Skenes: normal                 Vagina: mild atrophy, able to insert 2 fingers, uncomfortable              Cervix: no lesions               Bimanual Exam:  Uterus:  uterus absent              Adnexa: no mass, fullness, tenderness               Rectovaginal: Confirms               Anus:  normal sphincter tone, no lesions  Chaperone was present for exam.  A:  Well Woman with normal exam  Abdominal/pelvic pain  Vaginal atrophy  Dyspareunia   Foot pain, sounds like a neuropathy, she has seen podiatry and her primary. Normal HgbA1C   P:   Start vagifem  Return for GYN ultrasound  Refer to Neurology for her foot pain  No pap needed  Dawson, vit D  Get a copy of op note  Start vagifem  Discussed lubrication and her controlling rate and depth of penetration with intercourse

## 2015-04-16 LAB — VITAMIN D 25 HYDROXY (VIT D DEFICIENCY, FRACTURES): VIT D 25 HYDROXY: 40 ng/mL (ref 30–100)

## 2015-04-16 NOTE — Telephone Encounter (Signed)
Spoke with patient. She has seen Dr. Paulla Dolly at [facility]. She is requesting and additional evaluation. She has been reading about neuropathy as Dr. Talbert Nan has mentioned and she feels this may be her problem.  She is agreeable to neurology referral. Advised referral is placed and she will be contacted with referral information and Pelvic ultrasound  Scheduling.  Patient agreeable.  Routing to provider for final review. Patient agreeable to disposition. Will close encounter.   cc Lerry Liner for insurance pre-certification, referral processing and patient contact.

## 2015-04-20 ENCOUNTER — Ambulatory Visit: Payer: Self-pay | Admitting: Neurology

## 2015-04-20 ENCOUNTER — Telehealth: Payer: Self-pay

## 2015-04-20 NOTE — Telephone Encounter (Signed)
Spoke with patient. Results given as seen below. Patient is agreeable and verbalizes understanding. Patient states she is currently taking Vitamin D 4,000 IU daily. Advised with Vitamin D being within the normal range at 40 she may start taking OTC Vitamin D 3 1,000 IU daily. Patient is agreeable. Would like to schedule PUS at this time. Appointment scheduled for 04/28/2015 at 2 pm with 2:30 pm consult with Dr.Jertson. Patient is agreeable to date and time. Order has already been placed for precert.  Cc: Lerry Liner  Routing to provider for final review. Patient agreeable to disposition. Will close encounter.

## 2015-04-20 NOTE — Telephone Encounter (Signed)
-----   Message from Salvadore Dom, MD sent at 04/16/2015  5:20 PM EST ----- Please inform the patient that her [facility] is in the menopausal range and her Vit D is normal.

## 2015-04-21 ENCOUNTER — Telehealth: Payer: Self-pay | Admitting: Obstetrics and Gynecology

## 2015-04-21 ENCOUNTER — Other Ambulatory Visit: Payer: Self-pay

## 2015-04-21 DIAGNOSIS — R102 Pelvic and perineal pain: Secondary | ICD-10-CM

## 2015-04-21 NOTE — Telephone Encounter (Signed)
Spoke with pt regarding benefit for ultrasound. Patient understood and agreeable. Patient ready to schedule. Patient scheduled 04/27/14 with Dr. Talbert Nan. Pt aware of arrival date and time. Pt aware of 72 hours cancellation policy with [id number] fee. No further questions. Ok to close

## 2015-04-22 ENCOUNTER — Other Ambulatory Visit: Payer: Self-pay | Admitting: Obstetrics and Gynecology

## 2015-04-22 DIAGNOSIS — R102 Pelvic and perineal pain: Secondary | ICD-10-CM

## 2015-04-27 ENCOUNTER — Encounter: Payer: Self-pay | Admitting: Neurology

## 2015-04-27 ENCOUNTER — Ambulatory Visit (INDEPENDENT_AMBULATORY_CARE_PROVIDER_SITE_OTHER): Payer: Managed Care, Other (non HMO) | Admitting: Neurology

## 2015-04-27 VITALS — BP 139/85 | HR 81 | Resp 20 | Ht 66.0 in | Wt 196.0 lb

## 2015-04-27 DIAGNOSIS — H8110 Benign paroxysmal vertigo, unspecified ear: Secondary | ICD-10-CM | POA: Diagnosis not present

## 2015-04-27 DIAGNOSIS — R202 Paresthesia of skin: Secondary | ICD-10-CM | POA: Diagnosis not present

## 2015-04-27 MED ORDER — GABAPENTIN 100 MG PO CAPS: 300.0000 mg | ORAL_CAPSULE | Freq: Three times a day (TID) | ORAL | Status: DC

## 2015-04-27 NOTE — Progress Notes (Signed)
PATIENT: Bethany Gardner DOB: 1960/09/18  Chief Complaint  Patient presents with  . Pain    bilateral foot pain, had it for a year, can hardly work, has seen PCP and podiatry, not taking any medications for it, thinks it is like diabetic foot pain     HISTORICAL  Bethany Gardner is a 55 years old right-handed female, seen in refer by her primary care physician Dr. Lucretia Kern for evaluation of bilateral foot pain  In 2016, after all very stressful year, she noticed bilateral feet paresthesia, involving bottom and top of her feet, numbness tingling burning, stabbing pain, constant deep achy pain," as if my feet was run over by a truck", difficult to walk, she has been evaluated by podiatrist, tried thermal treatment, morbidly, without helping her symptoms, she denies significant low back pain, mild unsteady gait due to feet pain, she denies bilateral fingertips paresthesia, she denies bowel and bladder incontinence.  She also complains of vertigo since April 20 2015, it happened when she visual in her bed suddenly to one direction, lasting for a few minutes, no hearing loss, no tinnitus, no visual change, similar symptoms happened in the past.  She also reported 50 pound weight gain over 2 years.  REVIEW OF SYSTEMS: Full 14 system review of systems performed and notable only for as above  ALLERGIES: No Known Allergies  HOME MEDICATIONS: Current Outpatient Prescriptions  Medication Sig Dispense Refill  . ibuprofen (ADVIL,MOTRIN) 100 MG tablet Take 200 mg by mouth at bedtime.    . Estradiol (VAGIFEM) 10 MCG TABS vaginal tablet Place 1 tablet per vagina every night x 1 week, then 2 x a week at hs (Patient not taking: Reported on 04/27/2015) 24 tablet 0   No current facility-administered medications for this visit.    PAST MEDICAL HISTORY: Past Medical History  Diagnosis Date  . Foot pain     bilateral - occ foot pain  . Obesity   . Endometriosis   . Fibroid   . Infertility,  female     PAST SURGICAL HISTORY: Past Surgical History  Procedure Laterality Date  . Abdominal hysterectomy    . Laproscopy      three  . Tonsillectomy    . Breast biopsy Left     benign  . Dilation and curettage of uterus      FAMILY HISTORY: Family History  Problem Relation Age of Onset  . Heart disease Father   . Heart attack Father 16  . Transient ischemic attack Mother   . Stroke Mother   . Breast cancer Maternal Grandmother   . Colon cancer Neg Hx   . Colon polyps Neg Hx   . Esophageal cancer Neg Hx   . Rectal cancer Neg Hx   . Stomach cancer Neg Hx   . Melanoma Sister     SOCIAL HISTORY:  Social History   Social History  . Marital Status: Married    Spouse Name: N/A  . Number of Children: N/A  . Years of Education: N/A   Occupational History  . Not on file.   Social History Main Topics  . Smoking status: Never Smoker   . Smokeless tobacco: Never Used  . Alcohol Use: 4.2 oz/week    4 Glasses of wine, 3 Standard drinks or equivalent per week     Comment: occasionally  . Drug Use: No  . 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  PHYSICAL EXAMNIATION:  Gen: NAD, conversant, well nourised, obese, well groomed                     Cardiovascular: Regular rate rhythm, no peripheral edema, warm, nontender. Eyes: Conjunctivae clear without exudates or hemorrhage Neck: Supple, no carotid bruise. Pulmonary: Clear to auscultation  bilaterally   NEUROLOGICAL EXAM:  MENTAL STATUS: Speech:    Speech is normal; fluent and spontaneous with normal comprehension.  Cognition:     Orientation to time, place and person     Normal recent and remote memory     Normal Attention span and concentration     Normal Language, naming, repeating,spontaneous speech     Fund of knowledge   CRANIAL NERVES: CN II: Visual fields are full to confrontation. Fundoscopic exam is normal with sharp discs and no vascular changes. Pupils are round equal and briskly reactive to light. CN III, IV, VI: extraocular movement are normal. No ptosis. CN V: Facial sensation is intact to pinprick in all 3 divisions bilaterally. Corneal responses are intact.  CN VII: Face is symmetric with normal eye closure and smile. CN VIII: Hearing is normal to rubbing fingers CN IX, X: Palate elevates symmetrically. Phonation is normal. CN XI: Head turning and shoulder shrug are intact CN XII: Tongue is midline with normal movements and no atrophy.  MOTOR: There is no pronator drift of out-stretched arms. Muscle bulk and tone are normal. Muscle strength is normal.  REFLEXES: Reflexes are 2+ and symmetric at the biceps, triceps, knees, and ankles. Plantar responses are flexor.  SENSORY: Intact to light touch, pinprick, position sense, and vibration sense are intact in fingers and toes.  COORDINATION: Rapid alternating movements and fine finger movements are intact. There is no dysmetria on finger-to-nose and heel-knee-shin.    GAIT/STANCE: Posture is normal. Gait is steady with normal steps, base, arm swing, and turning. Heel and toe walking are normal. Tandem gait is normal.  Romberg is absent.  DIAGNOSTIC DATA (LABS, IMAGING, TESTING) - I reviewed patient records, labs, notes, testing and imaging myself where available.   ASSESSMENT AND PLAN  Bethany Gardner is a 55 y.o. female    Bilateral feet paresthesia  Differentiation diagnosis includes small  fiber neuropathy  Laboratory evaluations,  EMG nerve conduction study  Gabapentin 100 mg titrating to 3 tablets 3 times a day  Vertigo  Positional related, most consistent with benign positional vertigo  Try repositional maneuver   Marcial Pacas, M.D. Ph.D.  [facility] [address] [place], [place] [address] Ph: [phone] Fax: [phone]  CC: Lucretia Kern, DO

## 2015-04-28 ENCOUNTER — Encounter: Payer: Self-pay | Admitting: Obstetrics and Gynecology

## 2015-04-28 ENCOUNTER — Ambulatory Visit (INDEPENDENT_AMBULATORY_CARE_PROVIDER_SITE_OTHER): Payer: Managed Care, Other (non HMO) | Admitting: Obstetrics and Gynecology

## 2015-04-28 ENCOUNTER — Ambulatory Visit (INDEPENDENT_AMBULATORY_CARE_PROVIDER_SITE_OTHER): Payer: Managed Care, Other (non HMO)

## 2015-04-28 VITALS — BP 112/78 | HR 80 | Resp 16 | Wt 197.0 lb

## 2015-04-28 DIAGNOSIS — R109 Unspecified abdominal pain: Principal | ICD-10-CM

## 2015-04-28 DIAGNOSIS — M25579 Pain in unspecified ankle and joints of unspecified foot: Secondary | ICD-10-CM

## 2015-04-28 DIAGNOSIS — R102 Pelvic and perineal pain: Secondary | ICD-10-CM | POA: Diagnosis not present

## 2015-04-28 DIAGNOSIS — R1030 Lower abdominal pain, unspecified: Secondary | ICD-10-CM

## 2015-04-28 DIAGNOSIS — K5900 Constipation, unspecified: Secondary | ICD-10-CM

## 2015-04-28 LAB — CBC
Hematocrit: 37.9 % (ref 34.0–46.6)
Hemoglobin: 12.8 g/dL (ref 11.1–15.9)
MCH: 30.9 pg (ref 26.6–33.0)
MCHC: 33.8 g/dL (ref 31.5–35.7)
MCV: 92 fL (ref 79–97)
PLATELETS: 233 10*3/uL (ref 150–379)
RBC: 4.14 x10E6/uL (ref 3.77–5.28)
RDW: 13.2 % (ref 12.3–15.4)
WBC: 5.3 10*3/uL (ref 3.4–10.8)

## 2015-04-28 LAB — COMPREHENSIVE METABOLIC PANEL
A/G RATIO: 1.8 (ref 1.1–2.5)
ALT: 23 IU/L (ref 0–32)
AST: 24 IU/L (ref 0–40)
Albumin: 4.5 g/dL (ref 3.5–5.5)
Alkaline Phosphatase: 68 IU/L (ref 39–117)
BUN/Creatinine Ratio: 22 (ref 9–23)
BUN: 14 mg/dL (ref 6–24)
Bilirubin Total: 0.4 mg/dL (ref 0.0–1.2)
CALCIUM: 9.3 mg/dL (ref 8.7–10.2)
CO2: 24 mmol/L (ref 18–29)
CREATININE: 0.64 mg/dL (ref 0.57–1.00)
Chloride: 103 mmol/L (ref 96–106)
GFR, EST AFRICAN AMERICAN: 118 mL/min/{1.73_m2} (ref >59)
GFR, EST NON AFRICAN AMERICAN: 102 mL/min/{1.73_m2} (ref >59)
GLOBULIN, TOTAL: 2.5 g/dL (ref 1.5–4.5)
Glucose: 100 mg/dL — ABNORMAL HIGH (ref 65–99)
POTASSIUM: 5.5 mmol/L — AB (ref 3.5–5.2)
SODIUM: 143 mmol/L (ref 134–144)
TOTAL PROTEIN: 7 g/dL (ref 6.0–8.5)

## 2015-04-28 LAB — HGB A1C W/O EAG: Hgb A1c MFr Bld: 5.4 % (ref 4.8–5.6)

## 2015-04-28 LAB — ANA W/REFLEX IF POSITIVE: ANA: NEGATIVE

## 2015-04-28 LAB — SEDIMENTATION RATE: SED RATE: 10 mm/h (ref 0–40)

## 2015-04-28 LAB — THYROID PANEL WITH TSH
FREE THYROXINE INDEX: 2.4 (ref 1.2–4.9)
T3 Uptake Ratio: 31 % (ref 24–39)
T4, Total: 7.7 ug/dL (ref 4.5–12.0)
TSH: 1.53 u[IU]/mL (ref 0.450–4.500)

## 2015-04-28 LAB — C-REACTIVE PROTEIN: CRP: 1 mg/L (ref 0.0–4.9)

## 2015-04-28 LAB — VITAMIN B12: VITAMIN B 12: 390 pg/mL (ref 211–946)

## 2015-04-28 LAB — RPR: RPR Ser Ql: NONREACTIVE

## 2015-04-28 LAB — FOLATE: Folate: 12.1 ng/mL (ref >3.0)

## 2015-04-28 LAB — CK: CK TOTAL: 71 U/L (ref 24–173)

## 2015-04-28 NOTE — Progress Notes (Signed)
GYNECOLOGY  VISIT   HPI: 55 y.o.   Married  Caucasian  female   [id number] with No LMP recorded. Patient has had a hysterectomy.   here for a pelvic ultrasound she has intermittent severe pelvic/abominal pain. Last time it lasted 6-8 weeks.  She tried metamucil for 3 days, felt very gasy and uncomfortable. Only used it 1 x a day. She has been using laxatives every other day long term.  She was sent to neurology for a consultation for burning foot pain. She was given a script for gabapentin, hasn't started it yet (she is worried about weight gain). She has an appointment to go in for an EMG, the neurologist sent off a bunch of labs.   GYNECOLOGIC HISTORY: No LMP recorded. Patient has had a hysterectomy. Contraception:hysterectomy Menopausal hormone therapy: none        OB History    Gravida Para Term Preterm AB TAB SAB Ectopic Multiple Living   2 1 1  1  1   1          Patient Active Problem List   Diagnosis Date Noted  . Paresthesia 04/27/2015  . Benign paroxysmal positional vertigo 04/27/2015  . BMI 32.0-32.9,adult 09/10/2014  . Metatarsalgia of both feet 04/29/2014  . Capsulitis 04/29/2014    Past Medical History  Diagnosis Date  . Foot pain     bilateral - occ foot pain  . Obesity   . Endometriosis   . Fibroid   . Infertility, female     Past Surgical History  Procedure Laterality Date  . Abdominal hysterectomy    . Laproscopy      three  . Tonsillectomy    . Breast biopsy Left     benign  . Dilation and curettage of uterus      Current Outpatient Prescriptions  Medication Sig Dispense Refill  . Estradiol (VAGIFEM) 10 MCG TABS vaginal tablet Place 1 tablet per vagina every night x 1 week, then 2 x a week at hs 24 tablet 0  . gabapentin (NEURONTIN) 100 MG capsule Take 3 capsules (300 mg total) by mouth 3 (three) times daily. 270 capsule 6  . ibuprofen (ADVIL,MOTRIN) 100 MG tablet Take 200 mg by mouth at bedtime.     No current facility-administered  medications for this visit.     ALLERGIES: Review of patient's allergies indicates no known allergies.  Family History  Problem Relation Age of Onset  . Heart disease Father   . Heart attack Father 6  . Transient ischemic attack Mother   . Stroke Mother   . Breast cancer Maternal Grandmother   . Colon cancer Neg Hx   . Colon polyps Neg Hx   . Esophageal cancer Neg Hx   . Rectal cancer Neg Hx   . Stomach cancer Neg Hx   . Melanoma Sister     Social History   Social History  . Marital Status: Married    Spouse Name: N/A  . Number of Children: N/A  . Years of Education: N/A   Occupational History  . Not on file.   Social History Main Topics  . Smoking status: Never Smoker   . Smokeless tobacco: Never Used  . Alcohol Use: 4.2 oz/week    4 Glasses of wine, 3 Standard drinks or equivalent per week     Comment: occasionally  . Drug Use: No  . Sexual Activity:    Partners: Male    Birth Control/ Protection: Surgical  Comment: Hysterectomy   Other Topics Concern  . Not on file   Social History Narrative   Work or School: homemaker      Home Situation: lives with husband and son in [place] (also has a daughter)      Spiritual Beliefs:  Christian      Lifestyle: restarting a healthy diet and exercise in 2016          Review of Systems  Constitutional: Negative.   Eyes: Negative.   Respiratory: Negative.   Cardiovascular: Negative.   Gastrointestinal: Negative.   Genitourinary:       Pelvic pain    Musculoskeletal: Positive for neck pain. Negative for falls.  Skin: Negative.   Neurological: Negative.   Endo/Heme/Allergies: Negative.   Psychiatric/Behavioral: Negative.     PHYSICAL EXAMINATION:    BP 112/78 mmHg  Pulse 80  Resp 16  Wt 197 lb (89.359 kg)    General appearance: alert, cooperative and appears stated age   ASSESSMENT Intermittent, severe abdominal/pelvic pain. She has a h/o a hysterectomy, has a PMP [place] and small normal  appearing ovaries bilaterally. I told her that I didn't think her ovaries were a likely source of her pain. I doubt adhesions are the cause of her pain Constipation, long term use of laxatives, didn't feel good with metamucil    PLAN Return with pain for exam, want to check her pelvic floor for possible musculoskeletal source.  Try miralax for constipation, if it persists f/u with GI F/U with neurology for nerve pain Discussed with her being PMP with small ovaries, I think it is very unlikely to be a gyn source, possible MS   An After Visit Summary was printed and given to the patient.  15 minutes face to face time of which over 50% was spent in counseling.

## 2015-06-08 ENCOUNTER — Encounter: Payer: Self-pay | Admitting: Neurology

## 2015-12-11 ENCOUNTER — Encounter: Payer: Self-pay | Admitting: Family Medicine

## 2015-12-11 ENCOUNTER — Ambulatory Visit (INDEPENDENT_AMBULATORY_CARE_PROVIDER_SITE_OTHER): Payer: Managed Care, Other (non HMO) | Admitting: Family Medicine

## 2015-12-11 VITALS — BP 110/82 | HR 85 | Temp 98.0°F | Ht 66.0 in

## 2015-12-11 DIAGNOSIS — M79673 Pain in unspecified foot: Secondary | ICD-10-CM | POA: Diagnosis not present

## 2015-12-11 LAB — BASIC METABOLIC PANEL
BUN: 11 mg/dL (ref 6–23)
CHLORIDE: 106 meq/L (ref 96–112)
CO2: 28 meq/L (ref 19–32)
Calcium: 9.2 mg/dL (ref 8.4–10.5)
Creatinine, Ser: 0.61 mg/dL (ref 0.40–1.20)
GFR: 108.45 mL/min (ref >60.00)
Glucose, Bld: 88 mg/dL (ref 70–99)
POTASSIUM: 3.7 meq/L (ref 3.5–5.1)
SODIUM: 140 meq/L (ref 135–145)

## 2015-12-11 NOTE — Progress Notes (Signed)
HPI:  Acute visit for:  Bilateral foot pain: -for several years, usually in the plantar heal or midfoot, but can move around - dull ache, constant - cant do anything, cant run -has seen several podiatrist, Sports medicine and neurologist per her report - they told her plantar fasciitis which she reports she does not think is the correct dx -reports she has had imaging, [place] and multiple extensive evaluations but that nobody listens or helps her -reports has had steroid injections, oral steroids and various medications which don't help -saw chiropractor recently and he was talking about thyroid, toxicities, adrenal issues and gut health  -she doesn't have any other symptoms other then feel sheds hair more then average, but still has very thick hair -no weakness, numbness, malaise, wt loss, fevers, pain elsewhere, swelling, joint pain elsewhere -reports can cycle and do water aerobics -only wears flip flop sandals - one type as reports cant wear anything else -wants to check thyroid, diabetes lab, adrenals, toxicities as reports nobody has checked labs except me last year  ROS: See pertinent positives and negatives per HPI.  Past Medical History:  Diagnosis Date  . Endometriosis   . Fibroid   . Foot pain    bilateral - occ foot pain  . Infertility, female   . Obesity     Past Surgical History:  Procedure Laterality Date  . ABDOMINAL HYSTERECTOMY    . BREAST BIOPSY Left    benign  . DILATION AND CURETTAGE OF UTERUS    . laproscopy     three  . TONSILLECTOMY      Family History  Problem Relation Age of Onset  . Heart disease Father   . Heart attack Father 32  . Transient ischemic attack Mother   . Stroke Mother   . Breast cancer Maternal Grandmother   . Colon cancer Neg Hx   . Colon polyps Neg Hx   . Esophageal cancer Neg Hx   . Rectal cancer Neg Hx   . Stomach cancer Neg Hx   . Melanoma Sister     Social History   Social History  . Marital status: Married   Spouse name: N/A  . Number of children: N/A  . Years of education: N/A   Social History Main Topics  . Smoking status: Never Smoker  . Smokeless tobacco: Never Used  . Alcohol use 4.2 oz/week    4 Glasses of wine, 3 Standard drinks or equivalent per week     Comment: occasionally  . Drug use: No  . Sexual activity: Yes    Partners: Male    Birth control/ protection: Surgical     Comment: Hysterectomy   Other Topics Concern  . None   Social History Narrative   Work or School: homemaker      Home Situation: lives with husband and son in [place] (also has a daughter)      Spiritual Beliefs:  Christian      Lifestyle: restarting a healthy diet and exercise in 2016           Current Outpatient Prescriptions:  .  ibuprofen (ADVIL,MOTRIN) 100 MG tablet, Take 200 mg by mouth at bedtime., Disp: , Rfl:  .  NON FORMULARY, Juice plus at bedtime, Disp: , Rfl:  .  NON FORMULARY, Adrenal cream on wrists-daily, Disp: , Rfl:  .  VALERIAN PO, Take by mouth at bedtime., Disp: , Rfl:   EXAM:  Vitals:   12/11/15 1030  BP: 110/82  Pulse: 85  Temp: 98 F (36.7 C)    There is no height or weight on file to calculate BMI.  GENERAL: vitals reviewed and listed above, alert, oriented, appears well hydrated and in no acute distress  HEENT: atraumatic, conjunttiva clear, no obvious abnormalities on inspection of external nose and ears  NECK: no obvious masses on inspection  LUNGS: clear to auscultation bilaterally, no wheezes, rales or rhonchi, good air movement  CV: HRRR, no peripheral edema  MS: moves all extremities without noticeable abnormality, normal inspection of both feet and legs except for mild pes planus with mild talar valgus deformity R, normal pedal pulses, normal cap refill  PSYCH: pleasant and cooperative, no obvious depression or anxiety  ASSESSMENT AND PLAN:  Discussed the following assessment and plan:  Foot pain, unspecified laterality - Plan: Basic  metabolic panel  Listened for > 20 minutes then spent >10 minutes discussing various causes foot pain, labs she requested, options for adrenal and toxicity testing (advised integrative care for this) She actually had very extensive lab testing earlier this year per review of chart and we gave her these results which appear to be all normal except potassium mildly off so rechecking. She has not really followed up with the various doctors and did advise it may take trial and error to find something that helps and suggested follow up with Dr. Tamala Julian as she seems interested in supplements, CAM and he does seem to recommend these modalities.Provided the names of several integrative clinics. Advised gait analysis to see if a different footwear could help - seems seems upset at this suggestion and reports can't wear any other shoes.  I  advised supporting the whole body the best as possible with healthy diet, exercise she can tolerate (cycling and water aerobics) and stress management while pursuing specialist follow up and possibly integrative eval. -Patient advised to return or notify a doctor immediately if symptoms worsen or persist or new concerns arise.  Patient Instructions  BEFORE YOU LEAVE: -follow up: as needed and yearly -labs done in January with another provided  You had extensive labwork in January including thyroid. My assistant has given you a copy of these. Rechecking potassium - but otherwise normal.  Consider [facility] or [facility].  We have ordered labs or studies at this visit. It can take up to 1-2 weeks for results and processing. IF results require follow up or explanation, we will call you with instructions. Clinically stable results will be released to your [facility]. If you have not heard from [place] or cannot find your results in [facility] in 2 weeks please contact our office at [phone].  If you are not yet signed up for [facility], please consider  signing up.  We recommend the following healthy lifestyle for LIFE: 1) Small portions.   Tip: eat off of a salad plate instead of a dinner plate.  Tip: It is ok to feel hungry after a meal - that likely means you ate an appropriate  Portion.  Tip: if you need more or a snack choose fruits, veggies and/or a handful of nuts  or seeds.  2) Eat a healthy clean diet.  * Tip: Avoid (less then 1 serving per week): processed foods, sweets, sweetened  drinks, white starches (rice, flour, bread, potatoes, pasta, etc), red meat, fast  foods, butter  *Tip: CHOOSE instead   * 5-9 servings per day of fresh or frozen fruits and vegetables (but   not corn, potatoes, bananas, canned or dried fruit)   *  nuts and seeds, beans   *olives and olive oil   *small portions of lean meats such as fish and white chicken    *small portions of whole grains  3)Get at least 150 minutes of sweaty aerobic exercise per week.  4)Reduce stress - consider counseling, meditation and relaxation to balance other aspects of your life.     Colin Benton R., DO

## 2015-12-11 NOTE — Patient Instructions (Addendum)
BEFORE YOU LEAVE: -follow up: as needed and yearly -labs done in January with another provided  You had extensive labwork in January including thyroid. My assistant has given you a copy of these. Rechecking potassium - but otherwise normal.  Consider [facility] or [facility].  We have ordered labs or studies at this visit. It can take up to 1-2 weeks for results and processing. IF results require follow up or explanation, we will call you with instructions. Clinically stable results will be released to your [facility]. If you have not heard from [place] or cannot find your results in [facility] in 2 weeks please contact our office at [phone].  If you are not yet signed up for [facility], please consider signing up.  We recommend the following healthy lifestyle for LIFE: 1) Small portions.   Tip: eat off of a salad plate instead of a dinner plate.  Tip: It is ok to feel hungry after a meal - that likely means you ate an appropriate  Portion.  Tip: if you need more or a snack choose fruits, veggies and/or a handful of nuts  or seeds.  2) Eat a healthy clean diet.  * Tip: Avoid (less then 1 serving per week): processed foods, sweets, sweetened  drinks, white starches (rice, flour, bread, potatoes, pasta, etc), red meat, fast  foods, butter  *Tip: CHOOSE instead   * 5-9 servings per day of fresh or frozen fruits and vegetables (but   not corn, potatoes, bananas, canned or dried fruit)   *nuts and seeds, beans   *olives and olive oil   *small portions of lean meats such as fish and white chicken    *small portions of whole grains  3)Get at least 150 minutes of sweaty aerobic exercise per week.  4)Reduce stress - consider counseling, meditation and relaxation to balance other aspects of your life.

## 2015-12-11 NOTE — Progress Notes (Signed)
Pre visit review using our clinic review tool, if applicable. No additional management support is needed unless otherwise documented below in the visit note. 

## 2015-12-29 ENCOUNTER — Telehealth: Payer: Self-pay | Admitting: Obstetrics and Gynecology

## 2015-12-29 DIAGNOSIS — L659 Nonscarring hair loss, unspecified: Secondary | ICD-10-CM

## 2015-12-29 NOTE — Telephone Encounter (Signed)
Spoke with patient. Patient states that over the last few months she has been experiencing increased hair loss. Though this was due to stress, but this has continued. Reports her PCP performed lab work in 04/2015 and advised her repeat lab work was not needed at this time. Patient is not on any new medications. Denies fatigue. No new shampoo. Has had increased weight gain. Unsure if weight gain is some a more sedentary lifestyle due to pain in the bottom of her feet or another cause. Patient states she has seen many doctors for her foot pain without many answers. Patient would like to be seen with out office to have lab work for evaluation. Advised I will speak with Dr.Jertson to discuss type of appointment needed, lab appointment versus OV. Patient is agreeable.

## 2015-12-29 NOTE — Telephone Encounter (Signed)
Patient is having massive hair loss and wants to come in to be seen for blood work.

## 2015-12-30 NOTE — Telephone Encounter (Signed)
I spoke with the patient. She c/o massive hair loss. She has incredible thick hair and states her volume is half of what it was a couple of months ago. She states this is a change since her last blood work in 1/17.  She denies any bald spots. She has been under increased stress.  Plan: she will return for a TSH, RPR, Serum iron and ferritin, and a total testosterone level. I will place the order and she will call for a lab visit.   Gay Filler, Do you have an opinion on a specific dermatologist to send this patient to for hair loss? Can you please call the patient with recommendations.  Thanks, Jilll

## 2016-01-01 ENCOUNTER — Other Ambulatory Visit (INDEPENDENT_AMBULATORY_CARE_PROVIDER_SITE_OTHER): Payer: Managed Care, Other (non HMO)

## 2016-01-01 DIAGNOSIS — L659 Nonscarring hair loss, unspecified: Secondary | ICD-10-CM

## 2016-01-02 LAB — RPR

## 2016-01-02 LAB — TSH: TSH: 1.52 mIU/L

## 2016-01-02 LAB — FERRITIN: Ferritin: 63 ng/mL (ref 10–232)

## 2016-01-03 LAB — IRON: Iron: 79 ug/dL (ref 45–160)

## 2016-01-03 LAB — TESTOSTERONE, TOTAL, LC/MS/MS: TESTOSTERONE, TOTAL, LC-MS-MS: 23 ng/dL (ref 2–45)

## 2016-01-04 ENCOUNTER — Telehealth: Payer: Self-pay | Admitting: *Deleted

## 2016-01-04 NOTE — Telephone Encounter (Signed)
Spoke with patient and gave info regarding referral to dermatology.  Patient has appointment with Bethany Gardner. 03-07-16 @ 10:20. [phone] -eh

## 2016-01-12 NOTE — Telephone Encounter (Signed)
See phone encounter from 01-04-16. Patient referred to [facility] dermatology and has appointment Nov 2017.   Encounter closed.

## 2016-02-08 ENCOUNTER — Other Ambulatory Visit: Payer: Self-pay | Admitting: Obstetrics and Gynecology

## 2016-02-08 DIAGNOSIS — Z1231 Encounter for screening mammogram for malignant neoplasm of breast: Secondary | ICD-10-CM

## 2016-03-08 ENCOUNTER — Ambulatory Visit
Admission: RE | Admit: 2016-03-08 | Discharge: 2016-03-08 | Disposition: A | Discharge status: Home / Self Care | Payer: Managed Care, Other (non HMO) | Source: Ambulatory Visit | Attending: Obstetrics and Gynecology | Admitting: Obstetrics and Gynecology

## 2016-03-08 DIAGNOSIS — Z1231 Encounter for screening mammogram for malignant neoplasm of breast: Secondary | ICD-10-CM

## 2016-03-10 ENCOUNTER — Other Ambulatory Visit: Payer: Self-pay | Admitting: Obstetrics and Gynecology

## 2016-03-10 DIAGNOSIS — R928 Other abnormal and inconclusive findings on diagnostic imaging of breast: Secondary | ICD-10-CM

## 2016-03-14 ENCOUNTER — Ambulatory Visit
Admission: RE | Admit: 2016-03-14 | Discharge: 2016-03-14 | Disposition: A | Discharge status: Home / Self Care | Payer: Managed Care, Other (non HMO) | Source: Ambulatory Visit | Attending: Obstetrics and Gynecology | Admitting: Obstetrics and Gynecology

## 2016-03-14 ENCOUNTER — Other Ambulatory Visit: Payer: Self-pay | Admitting: Obstetrics and Gynecology

## 2016-03-14 DIAGNOSIS — R921 Mammographic calcification found on diagnostic imaging of breast: Secondary | ICD-10-CM

## 2016-03-14 DIAGNOSIS — R928 Other abnormal and inconclusive findings on diagnostic imaging of breast: Secondary | ICD-10-CM

## 2016-03-16 ENCOUNTER — Telehealth: Payer: Self-pay | Admitting: *Deleted

## 2016-03-16 ENCOUNTER — Encounter: Payer: Self-pay | Admitting: *Deleted

## 2016-03-16 DIAGNOSIS — Z170 Estrogen receptor positive status [ER+]: Secondary | ICD-10-CM

## 2016-03-16 DIAGNOSIS — C50312 Malignant neoplasm of lower-inner quadrant of left female breast: Secondary | ICD-10-CM

## 2016-03-16 HISTORY — DX: Malignant neoplasm of lower-inner quadrant of left female breast: C50.312

## 2016-03-16 NOTE — Telephone Encounter (Signed)
Confirmed [facility] for 03/23/16 at 1215 .  Instructions and contact information given.

## 2016-03-17 ENCOUNTER — Telehealth: Payer: Self-pay | Admitting: *Deleted

## 2016-03-17 NOTE — Telephone Encounter (Signed)
Mailed [facility] packet to pt. 

## 2016-03-23 ENCOUNTER — Ambulatory Visit
Admission: RE | Admit: 2016-03-23 | Discharge: 2016-03-23 | Disposition: A | Discharge status: Home / Self Care | Payer: Managed Care, Other (non HMO) | Source: Ambulatory Visit | Attending: Radiation Oncology | Admitting: Radiation Oncology

## 2016-03-23 ENCOUNTER — Ambulatory Visit: Payer: Managed Care, Other (non HMO) | Attending: General Surgery | Admitting: Physical Therapy

## 2016-03-23 ENCOUNTER — Other Ambulatory Visit (HOSPITAL_BASED_OUTPATIENT_CLINIC_OR_DEPARTMENT_OTHER): Payer: Managed Care, Other (non HMO)

## 2016-03-23 ENCOUNTER — Ambulatory Visit (HOSPITAL_BASED_OUTPATIENT_CLINIC_OR_DEPARTMENT_OTHER): Payer: Managed Care, Other (non HMO) | Admitting: Oncology

## 2016-03-23 ENCOUNTER — Encounter: Payer: Self-pay | Admitting: Physical Therapy

## 2016-03-23 ENCOUNTER — Encounter: Payer: Self-pay | Admitting: Genetic Counselor

## 2016-03-23 VITALS — BP 141/71 | HR 77 | Temp 98.1°F | Resp 17 | Ht 66.0 in | Wt 192.4 lb

## 2016-03-23 DIAGNOSIS — Z808 Family history of malignant neoplasm of other organs or systems: Secondary | ICD-10-CM

## 2016-03-23 DIAGNOSIS — Z170 Estrogen receptor positive status [ER+]: Secondary | ICD-10-CM

## 2016-03-23 DIAGNOSIS — Z803 Family history of malignant neoplasm of breast: Secondary | ICD-10-CM

## 2016-03-23 DIAGNOSIS — R293 Abnormal posture: Secondary | ICD-10-CM | POA: Insufficient documentation

## 2016-03-23 DIAGNOSIS — D0512 Intraductal carcinoma in situ of left breast: Secondary | ICD-10-CM | POA: Diagnosis not present

## 2016-03-23 DIAGNOSIS — C50312 Malignant neoplasm of lower-inner quadrant of left female breast: Secondary | ICD-10-CM

## 2016-03-23 LAB — COMPREHENSIVE METABOLIC PANEL
ALBUMIN: 4 g/dL (ref 3.5–5.0)
ALT: 20 U/L (ref 0–55)
ANION GAP: 8 meq/L (ref 3–11)
AST: 21 U/L (ref 5–34)
Alkaline Phosphatase: 79 U/L (ref 40–150)
BILIRUBIN TOTAL: 0.42 mg/dL (ref 0.20–1.20)
BUN: 13 mg/dL (ref 7.0–26.0)
CALCIUM: 9.6 mg/dL (ref 8.4–10.4)
CO2: 25 mEq/L (ref 22–29)
CREATININE: 0.7 mg/dL (ref 0.6–1.1)
Chloride: 108 mEq/L (ref 98–109)
EGFR: >90 mL/min/{1.73_m2} (ref >90)
Glucose: 97 mg/dl (ref 70–140)
Potassium: 4.7 mEq/L (ref 3.5–5.1)
Sodium: 141 mEq/L (ref 136–145)
TOTAL PROTEIN: 7.8 g/dL (ref 6.4–8.3)

## 2016-03-23 LAB — CBC WITH DIFFERENTIAL/PLATELET
BASO%: 1.2 % (ref 0.0–2.0)
Basophils Absolute: 0.1 10*3/uL (ref 0.0–0.1)
EOS%: 1.9 % (ref 0.0–7.0)
Eosinophils Absolute: 0.1 10*3/uL (ref 0.0–0.5)
HEMATOCRIT: 41.5 % (ref 34.8–46.6)
HEMOGLOBIN: 13.9 g/dL (ref 11.6–15.9)
LYMPH#: 1.4 10*3/uL (ref 0.9–3.3)
LYMPH%: 27.6 % (ref 14.0–49.7)
MCH: 30.9 pg (ref 25.1–34.0)
MCHC: 33.4 g/dL (ref 31.5–36.0)
MCV: 92.6 fL (ref 79.5–101.0)
MONO#: 0.3 10*3/uL (ref 0.1–0.9)
MONO%: 6.8 % (ref 0.0–14.0)
NEUT%: 62.5 % (ref 38.4–76.8)
NEUTROS ABS: 3.2 10*3/uL (ref 1.5–6.5)
Platelets: 236 10*3/uL (ref 145–400)
RBC: 4.48 10*6/uL (ref 3.70–5.45)
RDW: 12.9 % (ref 11.2–14.5)
WBC: 5.1 10*3/uL (ref 3.9–10.3)

## 2016-03-23 NOTE — Therapy (Signed)
[place], [place], [address] Phone: [phone]   Fax:  [phone]  Physical Therapy Evaluation  Patient Details  Name: Bethany Gardner MRN: [id number] Date of Birth: Apr 28, 1960 Referring Provider: Dr. Autumn Messing  Encounter Date: 03/23/2016      PT End of Session - 03/23/16 1610    Visit Number 1   Number of Visits 1   PT Start Time 1410   PT Stop Time 6789  Also saw pt from 1530-1555 for a total of 48 minutes   PT Time Calculation (min) 23 min   Activity Tolerance Patient tolerated treatment well   Behavior During Therapy [facility] for tasks assessed/performed      Past Medical History:  Diagnosis Date  . Endometriosis   . Fibroid   . Foot pain    bilateral - occ foot pain  . Infertility, female   . Malignant neoplasm of lower-inner quadrant of left female breast ([place]) 03/16/2016  . Obesity     Past Surgical History:  Procedure Laterality Date  . ABDOMINAL HYSTERECTOMY    . BREAST BIOPSY Left    benign  . DILATION AND CURETTAGE OF UTERUS    . laproscopy     three  . TONSILLECTOMY      There were no vitals filed for this visit.       Subjective Assessment - 03/23/16 1611    Subjective Patient reports she is here today to be seen by her medical team for her newly diagnosed left breast cancer.   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 It is ER+/PR- and is located in the lower inner quadrant.  Axilla on ultrasound was negative.   Patient Stated Goals Reduce lymphedema risk and learn post op shoulder ROM HEP            [facility] PT Assessment - 03/23/16 0001      Assessment   Medical Diagnosis Left breast cancer   Referring Provider Dr. Autumn Messing   Onset Date/Surgical Date 03/08/16   Hand Dominance Right   Prior Therapy none     Precautions   Precautions Other (comment)    Precaution Comments Active breast cancer     Restrictions   Weight Bearing Restrictions No     Balance Screen   Has the patient fallen in the past 6 months No   Has the patient had a decrease in activity level because of a fear of falling?  No   Is the patient reluctant to leave their home because of a fear of falling?  No     Home Ecologist residence   Living Arrangements Spouse/significant other   Available Help at Discharge Family     Prior Function   Level of [place] Unemployed   Leisure She does not exercise     Cognition   Overall Cognitive Status Within Functional Limits for tasks assessed     Posture/Postural Control   Posture/Postural Control Postural limitations   Postural Limitations Rounded Shoulders;Forward head     ROM / Strength   AROM / PROM / Strength AROM;Strength     AROM   AROM Assessment Site Shoulder;Cervical   Right/Left Shoulder Right;Left   Right Shoulder Extension 46 Degrees   Right Shoulder Flexion 160 Degrees   Right Shoulder ABduction 175 Degrees   Right Shoulder Internal  Rotation 77 Degrees   Right Shoulder External Rotation 85 Degrees   Left Shoulder Extension 63 Degrees   Left Shoulder Flexion 151 Degrees   Left Shoulder ABduction 167 Degrees   Left Shoulder Internal Rotation 70 Degrees   Left Shoulder External Rotation 82 Degrees   Cervical Flexion WNL   Cervical Extension WNL   Cervical - Right Side Bend WNL   Cervical - Left Side Bend WNL   Cervical - Right Rotation WNL   Cervical - Left Rotation WNL     Strength   Overall Strength Within functional limits for tasks performed           LYMPHEDEMA/ONCOLOGY QUESTIONNAIRE - 03/23/16 1608      Type   Cancer Type Left breast cancer     Lymphedema Assessments   Lymphedema Assessments Upper extremities     Right Upper Extremity Lymphedema   10 cm Proximal to Olecranon Process 32.2 cm   Olecranon Process 27 cm    10 cm Proximal to Ulnar Styloid Process 23 cm   Just Proximal to Ulnar Styloid Process 15.5 cm   Across Hand at [employer] 18.2 cm   At [place] of 2nd Digit 6.5 cm     Left Upper Extremity Lymphedema   10 cm Proximal to Olecranon Process 33.2 cm   Olecranon Process 27.2 cm   10 cm Proximal to Ulnar Styloid Process 22 cm   Just Proximal to Ulnar Styloid Process 15.4 cm   Across Hand at [employer] 18.9 cm   At [place] of 2nd Digit 6.4 cm       Patient was instructed today in a home exercise program today for post op shoulder range of motion. These included active assist shoulder flexion in sitting, scapular retraction, wall walking with shoulder abduction, and hands behind head external rotation.  She was encouraged to do these twice a day, holding 3 seconds and repeating 5 times when permitted by her physician.         PT Education - 03/23/16 1609    Education provided Yes   Education Details Lymphedema risk reduction and post op shoulder ROM HEP   Person(s) Educated Patient;Spouse   Methods Explanation;Demonstration;Handout   Comprehension Verbalized understanding;Returned demonstration              Breast Clinic Goals - 03/23/16 1618      Patient will be able to verbalize understanding of pertinent lymphedema risk reduction practices relevant to her diagnosis specifically related to skin care.   Time 1   Period Days   Status Achieved     Patient will be able to return demonstrate and/or verbalize understanding of the post-op home exercise program related to regaining shoulder range of motion.   Time 1   Period Days   Status Achieved     Patient will be able to verbalize understanding of the importance of attending the postoperative After Breast Cancer Class for further lymphedema risk reduction education and therapeutic exercise.   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 It is ER+/PR- and is located in the lower inner quadrant.  Axilla on ultrasound was negative.  Her medical team met prior to her assessments to determine a recommended treatment plan.  She is planning to  have a left lumpectomy and sentinel node biopsy followed by radiation and anti-estrogen therapy.  There was much discussion about whether to perform a sentinel node biopsy as her cancer appears to be DCIS but appears to have a suspicion for invasive disease.  After she heard information from various doctors, she decided to go ahead with a sentinel node biopsy.  She may benefit from post op PT to regain shoulder ROM and reduce lymphedema risk.  Due to her lack of comorbidities, her eval is of low complexity.   Rehab Potential Excellent   Clinical Impairments Affecting Rehab Potential none   PT Frequency One time visit   PT Treatment/Interventions Patient/family education;Therapeutic exercise   PT Next Visit Plan Will f/u after surgery to determine PT needs   PT Home Exercise Plan Post op shoulder ROM HEP   Consulted and Agree with Plan of Care Patient;Family member/caregiver   Family Member Consulted Husband      Patient will benefit from skilled therapeutic intervention in order to improve the following deficits and impairments:  Postural dysfunction, Pain, Decreased knowledge of precautions, Impaired UE functional use, Decreased range of motion  Visit Diagnosis: 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  If the patient requires physical therapy at that time, a specific plan will be dictated and sent to the referring physician for approval. The patient was educated today on appropriate basic  range of motion exercises to begin post operatively and the importance of attending the After Breast Cancer class following surgery.  Patient was educated today on lymphedema risk reduction practices as it pertains to recommendations that will benefit the patient immediately following surgery.  She verbalized good understanding.  No additional physical therapy is indicated at this time.     Problem List Patient Active Problem List   Diagnosis Date Noted  . Malignant neoplasm of lower-inner quadrant of left female breast ([place]) 03/16/2016  . Paresthesia 04/27/2015  . Benign paroxysmal positional vertigo 04/27/2015  . BMI 32.0-32.9,adult 09/10/2014  . Metatarsalgia of both feet 04/29/2014  . Capsulitis 04/29/2014   Annia Friendly, PT 03/23/16 4:22 PM  [place], [place], [address] Phone: [phone]   Fax:  [phone]  Name: Bethany Gardner MRN: [id number] Date of Birth: [birth year]

## 2016-03-23 NOTE — Progress Notes (Signed)
Nutrition Assessment  Reason for Assessment:  Pt seen in Breast Clinic  ASSESSMENT:  55 year old female with new diagnosis of left breast cancer Past medical history reviewed  Medications:  reviewed  Labs: reviewed  Anthropometrics:   Height: 66 inches Weight: 192 lb BMI: 31   NUTRITION DIAGNOSIS: Food and nutrition related knowledge deficit related to new diagnosis of breast cancer as evidenced by no prior need for nutrition related information.  INTERVENTION:  Discussed and provided packet of information regarding nutritional tips for breast cancer patients.  Questions answered.  Teachback method used.      MONITORING, EVALUATION, and GOAL: Pt will consume a healthy plant based diet to maintain lean body mass throughout treatment.   Brooklin Rieger B. Zenia Resides, [place], [place] (pager)

## 2016-03-23 NOTE — Patient Instructions (Signed)

## 2016-03-23 NOTE — Progress Notes (Signed)
Radiation Oncology         ([phone]) [phone] ________________________________  Name: Bethany Gardner MRN: [id number]  Date: 03/23/2016  DOB: 06/18/1960  CC:KIM, Nickola Major., DO  Jovita Kussmaul, MD     REFERRING PHYSICIAN: Autumn Messing III, MD   DIAGNOSIS: The encounter diagnosis was Malignant neoplasm of lower-inner quadrant of left breast in female, estrogen receptor positive ([place]).  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 female who is seen for an initial consultation visit in breast [place] regarding the patient's diagnosis of DCIS of the left breast.  The patient presented was originally with suspicious calcifications on screening mammogram on 03/08/16.  A diagnostic mammogram of the left breast on 03/14/16 showed a small group of calcifications measuring 1.5 x 0.8 x 2.1 cm in the lower inner quadrant. Ultrasound at the time showed normal appearing fibroglandular tissue in the medial aspect of the left breast. Ultrasound of the left axilla was negative for adenopathy.  Stereotactic biopsy of this area was performed on 03/14/16 in the 7:00 position This returned positive for high grade DCIS with calcifications and a complex sclerosing lesion. There were foci highly suspicious for stromal invasion. (ER 60% +, PR 0% -)  The patient has not undergone an MRI scan of the breasts.  The patient presents today with her husband.  PREVIOUS RADIATION THERAPY: No   PAST MEDICAL HISTORY:  has a past medical history of Endometriosis; Fibroid; Foot pain; Infertility, female; Malignant neoplasm of lower-inner quadrant of left female breast ([place]) (03/16/2016); and Obesity.     PAST SURGICAL HISTORY: Past Surgical History:  Procedure Laterality Date  .  ABDOMINAL HYSTERECTOMY    . BREAST BIOPSY Left    benign  . DILATION AND CURETTAGE OF UTERUS    . laproscopy     three  . TONSILLECTOMY       FAMILY HISTORY: family history includes Breast cancer in her maternal grandmother; Heart attack (age of onset: 78) in her father; Heart disease in her father; Melanoma in her sister; Stroke in her mother; Transient ischemic attack in her mother.   SOCIAL HISTORY:  reports that she has never smoked. She has never used smokeless tobacco. She reports that she drinks about 4.2 oz of alcohol per week . She reports that she does not use drugs.   ALLERGIES: Patient has no known allergies.   MEDICATIONS:  Current Outpatient Prescriptions  Medication Sig Dispense Refill  . ibuprofen (ADVIL,MOTRIN) 100 MG tablet Take 200 mg by mouth at bedtime.    . NON FORMULARY Juice plus at bedtime    . NON FORMULARY Adrenal cream on wrists-daily    . VALERIAN PO Take by mouth at bedtime.     No current facility-administered medications for this encounter.      REVIEW OF SYSTEMS:  A 15 point review of systems is documented in the electronic medical record. This was obtained by the nursing staff. However, I reviewed this with the patient to discuss relevant findings and make appropriate changes.  Pertinent items noted in HPI and remainder of comprehensive ROS otherwise negative.   The patient reports pain in her feet and "wooshing noise" in her right ear.  PHYSICAL EXAM:  vitals were not taken for  this visit.  Vitals with BMI 03/23/2016  Height 5\' 6"   Weight 192 lbs 6 oz  BMI 99991111  Systolic [id number]  Diastolic 71  Pulse 77  Respirations 17   General: Well-developed, in no acute distress HEENT: Normocephalic, atraumatic; oral cavity clear Neck: Supple without any lymphadenopathy Cardiovascular: Regular rate and rhythm Respiratory: Clear to auscultation bilaterally Breasts: Status post biopsy in the left breast with mild bruising. Underlying biopsy change  present. No axillary adenopathy. Negative breast exam on the right. GI: Soft, nontender, normal bowel sounds Extremities: No edema present Neuro: No focal deficits  ECOG = 0  0 - Asymptomatic (Fully active, able to carry on all predisease activities without restriction)  1 - Symptomatic but completely ambulatory (Restricted in physically strenuous activity but ambulatory and able to carry out work of a light or sedentary nature. For example, light housework, office work)  2 - Symptomatic, <50% in bed during the day (Ambulatory and capable of all self care but unable to carry out any work activities. Up and about more than 50% of waking hours)  3 - Symptomatic, >50% in bed, but not bedbound (Capable of only limited self-care, confined to bed or chair 50% or more of waking hours)  4 - Bedbound (Completely disabled. Cannot carry on any self-care. Totally confined to bed or chair)  5 - Death   Eustace Pen MM, Creech RH, Tormey DC, et al. [phone]). "Toxicity and response criteria of the [facility]". [place] Oncol. 5 (6): 649-55   LABORATORY DATA:  Lab Results  Component Value Date   WBC 5.1 03/23/2016   HGB 13.9 03/23/2016   HCT 41.5 03/23/2016   MCV 92.6 03/23/2016   PLT 236 03/23/2016   Lab Results  Component Value Date   NA 141 03/23/2016   K 4.7 03/23/2016   CL 106 12/11/2015   CO2 25 03/23/2016   Lab Results  Component Value Date   ALT 20 03/23/2016   AST 21 03/23/2016   ALKPHOS 79 03/23/2016   BILITOT 0.42 03/23/2016      RADIOGRAPHY: Mm Digital Diagnostic Unilat L  Result Date: 03/14/2016 CLINICAL DATA:  Patient returns after screening study for evaluation of left breast calcifications. EXAM: 2D DIGITAL DIAGNOSTIC LEFT MAMMOGRAM WITH CAD AND ADJUNCT TOMO ULTRASOUND LEFT BREAST COMPARISON:  Previous exam(s). ACR Breast Density Category b: There are scattered areas of fibroglandular density. FINDINGS: Magnified views are performed of calcifications  in the lower inner quadrant of the left breast. On magnified views there is a small group of calcification which measures 1.5 x 0.8 x 2.1 cm. Calcifications are linear in arrangement in morphology and suspicious for ductal carcinoma in situ. Additional area of asymmetry is also further evaluated. No persistent abnormality is identified in the medial aspect of the breast on these additional views. Mammographic images were processed with CAD. On physical exam, I palpate no abnormality in the medial aspect of the left breast. Targeted ultrasound is performed, showing normal appearing fibroglandular tissue in the medial aspect of the left breast. Evaluation of the left axilla is negative for adenopathy. IMPRESSION: 1. Suspicious group of linear calcifications in the medial aspect of the left breast for which biopsy is indicated. Stereotactic guided core biopsy will be performed later today at the request of the patient. 2. No persistent abnormality in an area of possible left breast asymmetry. RECOMMENDATION: Stereotactic guided core biopsy. I have discussed the findings and recommendations with the patient. Results were also provided in writing at  the conclusion of the visit. If applicable, a reminder letter will be sent to the patient regarding the next appointment. BI-RADS CATEGORY  4: Suspicious. Electronically Signed   By: Nolon Nations M.D.   On: 03/14/2016 13:12   US Breast Ltd Uni Left Inc Axilla  Result Date: 03/14/2016 CLINICAL DATA:  Patient returns after screening study for evaluation of left breast calcifications. EXAM: 2D DIGITAL DIAGNOSTIC LEFT MAMMOGRAM WITH CAD AND ADJUNCT TOMO ULTRASOUND LEFT BREAST COMPARISON:  Previous exam(s). ACR Breast Density Category b: There are scattered areas of fibroglandular density. FINDINGS: Magnified views are performed of calcifications in the lower inner quadrant of the left breast. On magnified views there is a small group of calcification which measures 1.5 x  0.8 x 2.1 cm. Calcifications are linear in arrangement in morphology and suspicious for ductal carcinoma in situ. Additional area of asymmetry is also further evaluated. No persistent abnormality is identified in the medial aspect of the breast on these additional views. Mammographic images were processed with CAD. On physical exam, I palpate no abnormality in the medial aspect of the left breast. Targeted ultrasound is performed, showing normal appearing fibroglandular tissue in the medial aspect of the left breast. Evaluation of the left axilla is negative for adenopathy. IMPRESSION: 1. Suspicious group of linear calcifications in the medial aspect of the left breast for which biopsy is indicated. Stereotactic guided core biopsy will be performed later today at the request of the patient. 2. No persistent abnormality in an area of possible left breast asymmetry. RECOMMENDATION: Stereotactic guided core biopsy. I have discussed the findings and recommendations with the patient. Results were also provided in writing at the conclusion of the visit. If applicable, a reminder letter will be sent to the patient regarding the next appointment. BI-RADS CATEGORY  4: Suspicious. Electronically Signed   By: Nolon Nations M.D.   On: 03/14/2016 13:12   Mm Screening Breast Tomo Bilateral  Result Date: 03/09/2016 CLINICAL DATA:  Screening. EXAM: 2D DIGITAL SCREENING BILATERAL MAMMOGRAM WITH CAD AND ADJUNCT TOMO COMPARISON:  Previous exam(s). ACR Breast Density Category c: The breast tissue is heterogeneously dense, which may obscure small masses. FINDINGS: In the left breast, calcifications warrant further evaluation. In the right breast, no findings suspicious for malignancy. Images were processed with CAD. IMPRESSION: Further evaluation is suggested for calcifications in the left breast. RECOMMENDATION: Diagnostic mammogram of the left breast. (Code:[id number]) The patient will be contacted regarding the findings, and  additional imaging will be scheduled. BI-RADS CATEGORY  0: Incomplete. Need additional imaging evaluation and/or prior mammograms for comparison. Electronically Signed   By: Lillia Mountain M.D.   On: 03/09/2016 11:09   Mm Clip Placement Left  Result Date: 03/14/2016 CLINICAL DATA:  Status post stereotactic guided core biopsy of calcifications in the lower inner quadrant of the left breast. EXAM: DIAGNOSTIC LEFT MAMMOGRAM POST STEREOTACTIC BIOPSY COMPARISON:  Previous exam(s). FINDINGS: Mammographic images were obtained following stereotactic guided biopsy of left breast calcifications there are a few residual calcifications at the biopsy site. An X shaped clip is identified in the lower inner quadrant of the left breast following biopsy. IMPRESSION: Tissue marker clip in the expected location following biopsy. Final Assessment: Post Procedure Mammograms for Marker Placement Electronically Signed   By: Nolon Nations M.D.   On: 03/14/2016 15:44   Mm Lt Breast Bx W Loc Dev 1st Lesion Image Bx Spec Stereo Guide  Addendum Date: 03/15/2016   ADDENDUM REPORT: 03/15/2016 14:36 ADDENDUM: Pathology revealed HIGH GRADE DUCTAL  CARCINOMA IN SITU WITH CALCIFICATIONS, COMPLEX SCLEROSING LESION of the Left breast at the 7:00 o'clock location. This was found to be concordant by Dr. Nolon Nations. Pathology results were discussed with the patient by telephone. The patient reported doing well after the biopsy with tenderness at the site. Post biopsy instructions and care were reviewed and questions were answered. The patient was encouraged to call [facility] for any additional concerns. The patient was referred to [facility] on March 23, 2016. Pathology results reported by Terie Purser, RN on 03/15/2016. Electronically Signed   By: Nolon Nations M.D.   On: 03/15/2016 14:36   Result Date: 03/15/2016 CLINICAL DATA:   Patient presents for stereotactic guided core biopsy of left breast calcifications. EXAM: LEFT BREAST STEREOTACTIC CORE NEEDLE BIOPSY COMPARISON:  Previous exams. FINDINGS: The patient and I discussed the procedure of stereotactic-guided biopsy including benefits and alternatives. We discussed the high likelihood of a successful procedure. We discussed the risks of the procedure including infection, bleeding, tissue injury, clip migration, and inadequate sampling. Informed written consent was given. The usual time out protocol was performed immediately prior to the procedure. Using sterile technique and 1% Lidocaine as local anesthetic, under stereotactic guidance, a 9 gauge vacuum assisted device was used to perform core needle biopsy of calcifications in the lower inner quadrant of the left breast using a medial approach. Specimen radiograph was performed showing calcifications to be present. Specimens with calcifications are identified for pathology. At the conclusion of the procedure, an X-shaped tissue marker clip was deployed into the biopsy cavity. Follow-up 2-view mammogram was performed and dictated separately. IMPRESSION: Stereotactic-guided biopsy of left breast calcifications. No apparent complications. Electronically Signed: By: Nolon Nations M.D. On: 03/14/2016 15:01       IMPRESSION:  The patient has a recent diagnosis of high grade DCIS of the left breast. She appears to be a good candidate for breast conservation treatment with a left lumpectomy and sentinel lymph node biopsy.  I discussed with the patient the role of adjuvant radiation treatment in this setting. We discussed the potential benefit of radiation treatment, especially with regards to local control of the patient's tumor. We also discussed the possible side effects and risks of such a treatment as well.  All of the patient's questions were answered. The patient wishes to proceed with radiation treatment at the appropriate  time.  PLAN: I look forward to seeing the patient postoperatively to review her case and further discuss and coordinate an anticipated course of radiation treatment. At this time, I would anticipate a 6-1/2 week course of radiation treatment.      ________________________________   Jodelle Gross, MD, PhD  This document serves as a record of services personally performed by Kyung Rudd, MD. It was created on his behalf by Darcus Austin, a trained medical scribe. The creation of this record is based on the scribe's personal observations and the provider's statements to them. This document has been checked and approved by the attending provider.

## 2016-03-23 NOTE — Progress Notes (Signed)
[place]  Telephone:([phone]) [phone] Fax:(336) [phone]     ID: Bethany Gardner DOB: [birth year]  MR#: [id number]  [id number]  Patient Care Team: Lucretia Kern, DO as PCP - General (Family Medicine) Autumn Messing III, MD as Consulting Physician (General Surgery) Chauncey Cruel, MD as Consulting Physician (Oncology) Kyung Rudd, MD as Consulting Physician (Radiation Oncology) Salvadore Dom, MD as Consulting Physician (Obstetrics and Gynecology) Chauncey Cruel, MD OTHER MD:  CHIEF COMPLAINT: Ductal carcinoma in situ  CURRENT TREATMENT: Awaiting definitive surgery   BREAST CANCER HISTORY: Rosheda had screening mammography with tomography at the [facility] 03/08/2016 showing suspicious calcifications in the left breast. Left diagnostic mammography with tomography and left breast ultrasonography 03/14/2016 found the breast density to be category B. In the lower inner quadrant of the left breast there were calcifications measuring 2.1 cm, in a linear arrangement. There was also an area of asymmetry but no palpable abnormality on exam. Ultrasonography of the breast was noninformative an ultrasound of the breast was mammographically benign.  Biopsy of the area of calcifications 03/14/2016 showed (SAA [id number]) ductal carcinoma in situ, grade 3, involving a complex sclerosing lesion. Some areas were suspicious but not diagnostic for invasion. This was estrogen receptor positive at 60% with moderate staining, but progesterone receptor negative.  Her subsequent history is as detailed below  INTERVAL HISTORY: [name] was evaluated in the multidisciplinary breast cancer clinic 03/23/2016 accompanied by her husband Waunita Schooner. Her case was also presented in the multidisciplinary breast cancer conference that same morning. At that time a preliminary plan was proposed: Breast conserving surgery with sentinel lymph node sampling followed by radiation and consideration of  anti-estrogens  REVIEW OF SYSTEMS: There were no specific symptoms leading to the original mammogram, which was routinely scheduled. The patient denies unusual headaches, visual changes, nausea, vomiting, stiff neck, dizziness, or gait imbalance. There has been no cough, phlegm production, or pleurisy, no chest pain or pressure, and no change in bowel or bladder habits. The patient denies fever, rash, bleeding, unexplained fatigue or unexplained weight loss. she does have unusual pain in both feet, which keeps her from exercising regularly. She also has a whooshing in her right ear.  A detailed review of systems was otherwise entirely negative.   PAST MEDICAL HISTORY: Past Medical History:  Diagnosis Date  . Endometriosis   . Fibroid   . Foot pain    bilateral - occ foot pain  . Infertility, female   . Malignant neoplasm of lower-inner quadrant of left female breast ([place]) 03/16/2016  . Obesity     PAST SURGICAL HISTORY: Past Surgical History:  Procedure Laterality Date  . ABDOMINAL HYSTERECTOMY    . BREAST BIOPSY Left    benign  . DILATION AND CURETTAGE OF UTERUS    . laproscopy     three  . TONSILLECTOMY      FAMILY HISTORY Family History  Problem Relation Age of Onset  . Heart disease Father   . Heart attack Father 83  . Transient ischemic attack Mother   . Stroke Mother   . Breast cancer Maternal Grandmother   . Melanoma Sister   . Colon cancer Neg Hx   . Colon polyps Neg Hx   . Esophageal cancer Neg Hx   . Rectal cancer Neg Hx   . Stomach cancer Neg Hx   The patient's parents are living, in their late 81s as of December 2017. The patient has one brother, 2 sisters. The patient's sister was diagnosed with  melanoma at age 41. There is no history of breast or ovarian cancer in the family.   GYNECOLOGIC HISTORY:  No LMP recorded. Patient has had a hysterectomy. Menarche age 55, first live birth age 55. The patient is GX P1. She underwent hysterectomy with no  salpingo-oophorectomy in 2010. She did not take hormone replacement. 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 Her husband Waunita Schooner works as Health and safety inspector for [employer]. They moved here from the [facility] area in 2016. The patient's daughter patent is an Statistician in [place]. The patient's adopted son Laurann Montana lives at home with the patient.     ADVANCED DIRECTIVES: not in place   HEALTH MAINTENANCE: Social History  Substance Use Topics  . Smoking status: Never Smoker  . Smokeless tobacco: Never Used  . Alcohol use 4.2 oz/week    4 Glasses of wine, 3 Standard drinks or equivalent per week     Comment: occasionally     Colonoscopy:October 2016, [facility]  PAP:  Bone density:   No Known Allergies  Current Outpatient Prescriptions  Medication Sig Dispense Refill  . NON FORMULARY Juice plus at bedtime    . NON FORMULARY Adrenal cream on wrists-daily    . VALERIAN PO Take by mouth at bedtime.    Marland Kitchen ibuprofen (ADVIL,MOTRIN) 100 MG tablet Take 200 mg by mouth at bedtime.     No current facility-administered medications for this visit.     OBJECTIVE:  55 middle aged whiten in no acute distress   Vitals:   54/13/17 1258  BP: (!) 141/71  Pulse: 77  Resp: 17  Temp: 98.1 F (36.7 C)     Body mass index is 31.05 kg/m.    ECOG FS:0 - Asymptomatic  Ocular: Sclerae unicteric, pupils equal, round and reactive to light Ear-nose-throat: Oropharynx clear and moist Lymphatic: No cervical or supraclavicular adenopathy Lungs no rales or rhonchi, good excursion bilaterally Heart regular rate and rhythm, no murmur appreciated Abd soft, nontender, positive bowel sounds MSK no focal spinal tenderness, no joint edema Neuro: non-focal, well-oriented, appropriate affect Breasts:  The right breast is unremarkable. The left breast is status post  recent biopsy. I do not palpate a mass. There are no skin or nipple changes of concern. The left axilla is benign  LAB RESULTS:  CMP     Component Value Date/Time   NA 141 03/23/2016 1240   K 4.7 03/23/2016 1240   CL 106 12/11/2015 1126   CO2 25 03/23/2016 1240   GLUCOSE 97 03/23/2016 1240   BUN 13.0 03/23/2016 1240   CREATININE 0.7 03/23/2016 1240   CALCIUM 9.6 03/23/2016 1240   PROT 7.8 03/23/2016 1240   ALBUMIN 4.0 03/23/2016 1240   AST 21 03/23/2016 1240   ALT 20 03/23/2016 1240   ALKPHOS 79 03/23/2016 1240   BILITOT 0.42 03/23/2016 1240   GFRNONAA 102 04/27/2015 1346   GFRAA 118 04/27/2015 1346    INo results found for: SPEP, UPEP  Lab Results  Component Value Date   WBC 5.1 03/23/2016   NEUTROABS 3.2 03/23/2016   HGB 13.9 03/23/2016   HCT 41.5 03/23/2016   MCV 92.6 03/23/2016   PLT 236 03/23/2016      Chemistry      Component Value Date/Time   NA 141 03/23/2016 1240   K 4.7 03/23/2016 1240   CL 106  12/11/2015 1126   CO2 25 03/23/2016 1240   BUN 13.0 03/23/2016 1240   CREATININE 0.7 03/23/2016 1240      Component Value Date/Time   CALCIUM 9.6 03/23/2016 1240   ALKPHOS 79 03/23/2016 1240   AST 21 03/23/2016 1240   ALT 20 03/23/2016 1240   BILITOT 0.42 03/23/2016 1240       No results found for: LABCA2  No components found for: LABCA125  No results for input(s): INR in the last 168 hours.  Urinalysis No results found for: COLORURINE, APPEARANCEUR, LABSPEC, PHURINE, GLUCOSEU, HGBUR, BILIRUBINUR, KETONESUR, PROTEINUR, UROBILINOGEN, NITRITE, LEUKOCYTESUR   STUDIES: Mm Digital Diagnostic Unilat L  Result Date: 03/14/2016 CLINICAL DATA:  Patient returns after screening study for evaluation of left breast calcifications. EXAM: 2D DIGITAL DIAGNOSTIC LEFT MAMMOGRAM WITH CAD AND ADJUNCT TOMO ULTRASOUND LEFT BREAST COMPARISON:  Previous exam(s). ACR Breast Density Category b: There are scattered areas of fibroglandular density. FINDINGS: Magnified views  are performed of calcifications in the lower inner quadrant of the left breast. On magnified views there is a small group of calcification which measures 1.5 x 0.8 x 2.1 cm. Calcifications are linear in arrangement in morphology and suspicious for ductal carcinoma in situ. Additional area of asymmetry is also further evaluated. No persistent abnormality is identified in the medial aspect of the breast on these additional views. Mammographic images were processed with CAD. On physical exam, I palpate no abnormality in the medial aspect of the left breast. Targeted ultrasound is performed, showing normal appearing fibroglandular tissue in the medial aspect of the left breast. Evaluation of the left axilla is negative for adenopathy. IMPRESSION: 1. Suspicious group of linear calcifications in the medial aspect of the left breast for which biopsy is indicated. Stereotactic guided core biopsy will be performed later today at the request of the patient. 2. No persistent abnormality in an area of possible left breast asymmetry. RECOMMENDATION: Stereotactic guided core biopsy. I have discussed the findings and recommendations with the patient. Results were also provided in writing at the conclusion of the visit. If applicable, a reminder letter will be sent to the patient regarding the next appointment. BI-RADS CATEGORY  4: Suspicious. Electronically Signed   By: Nolon Nations M.D.   On: 03/14/2016 13:12   US Breast Ltd Uni Left Inc Axilla  Result Date: 03/14/2016 CLINICAL DATA:  Patient returns after screening study for evaluation of left breast calcifications. EXAM: 2D DIGITAL DIAGNOSTIC LEFT MAMMOGRAM WITH CAD AND ADJUNCT TOMO ULTRASOUND LEFT BREAST COMPARISON:  Previous exam(s). ACR Breast Density Category b: There are scattered areas of fibroglandular density. FINDINGS: Magnified views are performed of calcifications in the lower inner quadrant of the left breast. On magnified views there is a small group of  calcification which measures 1.5 x 0.8 x 2.1 cm. Calcifications are linear in arrangement in morphology and suspicious for ductal carcinoma in situ. Additional area of asymmetry is also further evaluated. No persistent abnormality is identified in the medial aspect of the breast on these additional views. Mammographic images were processed with CAD. On physical exam, I palpate no abnormality in the medial aspect of the left breast. Targeted ultrasound is performed, showing normal appearing fibroglandular tissue in the medial aspect of the left breast. Evaluation of the left axilla is negative for adenopathy. IMPRESSION: 1. Suspicious group of linear calcifications in the medial aspect of the left breast for which biopsy is indicated. Stereotactic guided core biopsy will be performed later today at the request of the patient.  2. No persistent abnormality in an area of possible left breast asymmetry. RECOMMENDATION: Stereotactic guided core biopsy. I have discussed the findings and recommendations with the patient. Results were also provided in writing at the conclusion of the visit. If applicable, a reminder letter will be sent to the patient regarding the next appointment. BI-RADS CATEGORY  4: Suspicious. Electronically Signed   By: Nolon Nations M.D.   On: 03/14/2016 13:12   Mm Screening Breast Tomo Bilateral  Result Date: 03/09/2016 CLINICAL DATA:  Screening. EXAM: 2D DIGITAL SCREENING BILATERAL MAMMOGRAM WITH CAD AND ADJUNCT TOMO COMPARISON:  Previous exam(s). ACR Breast Density Category c: The breast tissue is heterogeneously dense, which may obscure small masses. FINDINGS: In the left breast, calcifications warrant further evaluation. In the right breast, no findings suspicious for malignancy. Images were processed with CAD. IMPRESSION: Further evaluation is suggested for calcifications in the left breast. RECOMMENDATION: Diagnostic mammogram of the left breast. (Code:[id number]) The patient will be  contacted regarding the findings, and additional imaging will be scheduled. BI-RADS CATEGORY  0: Incomplete. Need additional imaging evaluation and/or prior mammograms for comparison. Electronically Signed   By: Lillia Mountain M.D.   On: 03/09/2016 11:09   Mm Clip Placement Left  Result Date: 03/14/2016 CLINICAL DATA:  Status post stereotactic guided core biopsy of calcifications in the lower inner quadrant of the left breast. EXAM: DIAGNOSTIC LEFT MAMMOGRAM POST STEREOTACTIC BIOPSY COMPARISON:  Previous exam(s). FINDINGS: Mammographic images were obtained following stereotactic guided biopsy of left breast calcifications there are a few residual calcifications at the biopsy site. An X shaped clip is identified in the lower inner quadrant of the left breast following biopsy. IMPRESSION: Tissue marker clip in the expected location following biopsy. Final Assessment: Post Procedure Mammograms for Marker Placement Electronically Signed   By: Nolon Nations M.D.   On: 03/14/2016 15:44   Mm Lt Breast Bx W Loc Dev 1st Lesion Image Bx Spec Stereo Guide  Addendum Date: 03/15/2016   ADDENDUM REPORT: 03/15/2016 14:36 ADDENDUM: Pathology revealed HIGH GRADE DUCTAL CARCINOMA IN SITU WITH CALCIFICATIONS, COMPLEX SCLEROSING LESION of the Left breast at the 7:00 o'clock location. This was found to be concordant by Dr. Nolon Nations. Pathology results were discussed with the patient by telephone. The patient reported doing well after the biopsy with tenderness at the site. Post biopsy instructions and care were reviewed and questions were answered. The patient was encouraged to call [facility] for any additional concerns. The patient was referred to [facility] on March 23, 2016. Pathology results reported by Terie Purser, RN on 03/15/2016. Electronically Signed   By: Nolon Nations M.D.   On: 03/15/2016 14:36    Result Date: 03/15/2016 CLINICAL DATA:  Patient presents for stereotactic guided core biopsy of left breast calcifications. EXAM: LEFT BREAST STEREOTACTIC CORE NEEDLE BIOPSY COMPARISON:  Previous exams. FINDINGS: The patient and I discussed the procedure of stereotactic-guided biopsy including benefits and alternatives. We discussed the high likelihood of a successful procedure. We discussed the risks of the procedure including infection, bleeding, tissue injury, clip migration, and inadequate sampling. Informed written consent was given. The usual time out protocol was performed immediately prior to the procedure. Using sterile technique and 1% Lidocaine as local anesthetic, under stereotactic guidance, a 9 gauge vacuum assisted device was used to perform core needle biopsy of calcifications in the lower inner quadrant of the left breast using a medial approach. Specimen radiograph  was performed showing calcifications to be present. Specimens with calcifications are identified for pathology. At the conclusion of the procedure, an X-shaped tissue marker clip was deployed into the biopsy cavity. Follow-up 2-view mammogram was performed and dictated separately. IMPRESSION: Stereotactic-guided biopsy of left breast calcifications. No apparent complications. Electronically Signed: By: Nolon Nations M.D. On: 03/14/2016 15:01    ELIGIBLE FOR AVAILABLE RESEARCH PROTOCOL:  now  ASSESSMENT: 55 y.o. [place] woman status post left breast lower inner quadrant biopsy 03/14/2016 for ductal carcinoma in situ, grade 3, estrogen receptor positive, progesterone receptor negative  (1) breast conserving surgery with sentinel lymph node sampling planned  (2) adjuvant radiation to follow  (3)  Tamoxifen started 03/24/2016 neoadjuvantly  PLAN: We spent the better part of today's hour-long appointment discussing the biology of breast cancer in general, and the specifics of the patient's tumor in  particular. Ama understands that in noninvasive ductal carcinoma, also called ductal carcinoma in situ ("DCIS") the breast cancer cells remain trapped in the ducts were they started. They cannot travel to a vital organ. For that reason these cancers in themselves are not life-threatening.  If the whole breast is removed then all the ducts are removed and since the cancer cells are trapped in the ducts, the cure rate with mastectomy for noninvasive breast cancer is approximately 99%. Nevertheless we recommend lumpectomy, because there is no survival advantage to mastectomy and because the cosmetic result is generally superior with breast conservation.  Since the patient is keeping her breasts, there will be some risk of recurrence. The recurrence can only be in the same breast since, again, the cells are trapped in the ducts. There is no connection from one breast to the other. The risk of local recurrence is cut by more than half with radiation, which is standard in this situation.  In estrogen receptor positive cancers like Betty's, anti-estrogens can also be considered. They will further reduce the risk of recurrence by one half. In addition anti-estrogens will lower the risk of a new breast cancer developing in either breast, also by one half. That risk approaches 1% per year. Anti-estrogens reduce it to 1/2%.   Wander understands there is a small possibility there may be  Invasive disease when she has her definitive surgery. For that reason she will have sentinel lymph node sampling. She understands also my expectation is that we will not find any invasive disease and that  Our discussion today was limited to that possibility  I think she would benefit from starting tamoxifen now since her may be some delay in definitive surgery given that the holidays are coming up. She is agreeable to this plan. Accordingly she will start tamoxifen and continue it right to surgery and radiation.Marland Kitchen She will let me  know if she has any unusual side effects or complications from this medication  Jakhiya has a good understanding of the overall plan. She agrees with it. She knows the goal of treatment in her case is cure. She will call with any problems that may develop before her next visit with me, which will be in approximately 3 months.  Chauncey Cruel, MD   03/24/2016 9:34 AM Medical Oncology and Hematology [facility] [address] [place], [place] [address] Tel. [phone]    Fax. [phone]

## 2016-03-24 ENCOUNTER — Other Ambulatory Visit: Payer: Self-pay | Admitting: *Deleted

## 2016-03-24 ENCOUNTER — Ambulatory Visit: Payer: Self-pay | Admitting: General Surgery

## 2016-03-24 DIAGNOSIS — D0512 Intraductal carcinoma in situ of left breast: Secondary | ICD-10-CM

## 2016-03-24 DIAGNOSIS — C50312 Malignant neoplasm of lower-inner quadrant of left female breast: Secondary | ICD-10-CM

## 2016-03-24 DIAGNOSIS — Z170 Estrogen receptor positive status [ER+]: Principal | ICD-10-CM

## 2016-03-24 MED ORDER — TAMOXIFEN CITRATE 20 MG PO TABS: 20.0000 mg | ORAL_TABLET | Freq: Every day | ORAL | 2 refills | Status: DC

## 2016-03-24 NOTE — Progress Notes (Signed)
ONCBCN DISTRESS SCREENING 03/23/2016  Screening Type Initial Screening  Distress experienced in past week (1-10) 7  Family Problem type Children  Information Concerns Type Lack of info about treatment  Referral to support programs Yes    Met with patient in Breast Multidisciplinary Clinic to introduce [facility] team/resources, reviewing distress screen per protocol.  The patient scored a 7 on the Psychosocial Distress Thermometer which indicates severe distress. Also assessed for distress and other psychosocial needs.  Patient attended clinic with her husband. Pt shared that her elevated distress was related to a personal issue involving their son, and that even her cancer diagnosis did not seem as severe as this other matter. Pt, her husband and son are all seeking professional counseling related to this personal matter. Counselor asked about additional support systems; pt shared that they were involved in their local church community. Pt shared that it had been difficult to build new community after moving to [place] two years ago. Counselor shared further details about [facility] and services and encouraged the pt to reach out if additional support sounded helpful at any point throughout this journey.  Rosana Fret, Counseling Mudlogger, Lorrin Jackson, Chaplain

## 2016-03-28 ENCOUNTER — Other Ambulatory Visit: Payer: Self-pay | Admitting: General Surgery

## 2016-03-28 DIAGNOSIS — D0512 Intraductal carcinoma in situ of left breast: Secondary | ICD-10-CM

## 2016-03-29 ENCOUNTER — Telehealth: Payer: Self-pay | Admitting: *Deleted

## 2016-03-29 NOTE — Telephone Encounter (Signed)
  Oncology Nurse Navigator Documentation  Navigator Location: [facility] (03/29/16 1100)   )Navigator Encounter Type: Telephone (03/29/16 1100) Telephone: Outgoing Call;Clinic/[facility]C Follow-up (03/29/16 1100)                                                  Time Spent with Patient: 15 (03/29/16 1100)

## 2016-03-30 ENCOUNTER — Encounter: Payer: Self-pay | Admitting: Radiation Oncology

## 2016-04-07 ENCOUNTER — Encounter (HOSPITAL_BASED_OUTPATIENT_CLINIC_OR_DEPARTMENT_OTHER): Payer: Self-pay | Admitting: *Deleted

## 2016-04-07 NOTE — Progress Notes (Signed)
Patient states that her father has Factor V Leiden but has never had issues with it. States she has never been tested. She states that she has already told Dr Ethlyn Gallery office this.

## 2016-04-07 NOTE — Progress Notes (Signed)
Dr Smith Robert made aware of Factor V Leiden family history, OK for [facility].

## 2016-04-11 DIAGNOSIS — Z923 Personal history of irradiation: Secondary | ICD-10-CM

## 2016-04-11 HISTORY — DX: Personal history of irradiation: Z92.3

## 2016-04-11 HISTORY — PX: BREAST LUMPECTOMY: SHX2

## 2016-04-12 ENCOUNTER — Telehealth: Payer: Self-pay | Admitting: *Deleted

## 2016-04-12 ENCOUNTER — Ambulatory Visit
Admission: RE | Admit: 2016-04-12 | Discharge: 2016-04-12 | Disposition: A | Discharge status: Home / Self Care | Payer: Managed Care, Other (non HMO) | Source: Ambulatory Visit | Attending: Oncology | Admitting: Oncology

## 2016-04-12 ENCOUNTER — Other Ambulatory Visit: Payer: Self-pay | Admitting: *Deleted

## 2016-04-12 DIAGNOSIS — R2232 Localized swelling, mass and lump, left upper limb: Secondary | ICD-10-CM

## 2016-04-12 DIAGNOSIS — C50312 Malignant neoplasm of lower-inner quadrant of left female breast: Secondary | ICD-10-CM

## 2016-04-12 NOTE — Telephone Encounter (Signed)
Received call from patient stating she found a lump in her left axilla about the size of a dime.  Was able to schedule patient for U/S of her left axilla for today 04/12/16 at 145pm.  She is scheduled for her seed placement tomorrow and surgery on 04/15/16 with Dr. Marlou Starks.  She also states her dog jumped on her last week and her left breast is sore.  I will inform Dr. Marlou Starks of these findings.

## 2016-04-13 ENCOUNTER — Ambulatory Visit
Admission: RE | Admit: 2016-04-13 | Discharge: 2016-04-13 | Disposition: A | Discharge status: Home / Self Care | Payer: Managed Care, Other (non HMO) | Source: Ambulatory Visit | Attending: General Surgery | Admitting: General Surgery

## 2016-04-13 DIAGNOSIS — D0512 Intraductal carcinoma in situ of left breast: Secondary | ICD-10-CM

## 2016-04-13 NOTE — Progress Notes (Signed)
Pt instructed to drink Boost before 0530 day of surgery with teach back method.

## 2016-04-15 ENCOUNTER — Encounter (HOSPITAL_BASED_OUTPATIENT_CLINIC_OR_DEPARTMENT_OTHER): Payer: Self-pay | Admitting: Certified Registered"

## 2016-04-15 ENCOUNTER — Ambulatory Visit (HOSPITAL_BASED_OUTPATIENT_CLINIC_OR_DEPARTMENT_OTHER): Payer: Managed Care, Other (non HMO) | Admitting: Certified Registered"

## 2016-04-15 ENCOUNTER — Encounter (HOSPITAL_BASED_OUTPATIENT_CLINIC_OR_DEPARTMENT_OTHER)
Admission: RE | Disposition: A | Discharge status: Home / Self Care | Payer: Self-pay | Source: Ambulatory Visit | Attending: General Surgery

## 2016-04-15 ENCOUNTER — Ambulatory Visit (HOSPITAL_BASED_OUTPATIENT_CLINIC_OR_DEPARTMENT_OTHER)
Admission: RE | Admit: 2016-04-15 | Discharge: 2016-04-15 | Disposition: A | Discharge status: Home / Self Care | Payer: Managed Care, Other (non HMO) | Source: Ambulatory Visit | Attending: General Surgery | Admitting: General Surgery

## 2016-04-15 ENCOUNTER — Encounter (HOSPITAL_COMMUNITY)
Admission: RE | Admit: 2016-04-15 | Discharge: 2016-04-15 | Disposition: A | Discharge status: Home / Self Care | Payer: Managed Care, Other (non HMO) | Source: Ambulatory Visit | Attending: General Surgery | Admitting: General Surgery

## 2016-04-15 ENCOUNTER — Ambulatory Visit
Admission: RE | Admit: 2016-04-15 | Discharge: 2016-04-15 | Disposition: A | Discharge status: Home / Self Care | Payer: Managed Care, Other (non HMO) | Source: Ambulatory Visit | Attending: General Surgery | Admitting: General Surgery

## 2016-04-15 DIAGNOSIS — D0512 Intraductal carcinoma in situ of left breast: Secondary | ICD-10-CM | POA: Insufficient documentation

## 2016-04-15 DIAGNOSIS — Z90710 Acquired absence of both cervix and uterus: Secondary | ICD-10-CM | POA: Diagnosis not present

## 2016-04-15 HISTORY — DX: Anemia, unspecified: D64.9

## 2016-04-15 HISTORY — PX: BREAST LUMPECTOMY WITH RADIOACTIVE SEED AND SENTINEL LYMPH NODE BIOPSY: SHX6550

## 2016-04-15 SURGERY — BREAST LUMPECTOMY WITH RADIOACTIVE SEED AND SENTINEL LYMPH NODE BIOPSY
Anesthesia: General | Site: Breast | Laterality: Left

## 2016-04-15 MED ORDER — CEFAZOLIN SODIUM-DEXTROSE 2-4 GM/100ML-% IV SOLN
INTRAVENOUS | Status: AC
  Filled 2016-04-15: qty 100

## 2016-04-15 MED ORDER — DEXAMETHASONE SODIUM PHOSPHATE 10 MG/ML IJ SOLN
INTRAMUSCULAR | Status: AC
  Filled 2016-04-15: qty 1

## 2016-04-15 MED ORDER — MIDAZOLAM HCL 2 MG/2ML IJ SOLN
INTRAMUSCULAR | Status: AC
  Filled 2016-04-15: qty 2

## 2016-04-15 MED ORDER — ONDANSETRON HCL 4 MG/2ML IJ SOLN: 4.0000 mg | Freq: Once | INTRAMUSCULAR | Status: DC | PRN

## 2016-04-15 MED ORDER — CHLORHEXIDINE GLUCONATE CLOTH 2 % EX PADS: 6.0000 | MEDICATED_PAD | Freq: Once | CUTANEOUS | Status: DC

## 2016-04-15 MED ORDER — EPHEDRINE 5 MG/ML INJ
INTRAVENOUS | Status: AC
  Filled 2016-04-15: qty 10

## 2016-04-15 MED ORDER — PROPOFOL 10 MG/ML IV BOLUS
INTRAVENOUS | Status: DC | PRN
  Administered 2016-04-15: 150 mg via INTRAVENOUS

## 2016-04-15 MED ORDER — LIDOCAINE 2% (20 MG/ML) 5 ML SYRINGE
INTRAMUSCULAR | Status: AC
  Filled 2016-04-15: qty 5

## 2016-04-15 MED ORDER — ONDANSETRON HCL 4 MG/2ML IJ SOLN
INTRAMUSCULAR | Status: AC
  Filled 2016-04-15: qty 2

## 2016-04-15 MED ORDER — MIDAZOLAM HCL 2 MG/2ML IJ SOLN
1.0000 mg | INTRAMUSCULAR | Status: DC | PRN
  Administered 2016-04-15 (×3): 1 mg via INTRAVENOUS

## 2016-04-15 MED ORDER — FENTANYL CITRATE (PF) 100 MCG/2ML IJ SOLN
INTRAMUSCULAR | Status: AC
  Filled 2016-04-15: qty 2

## 2016-04-15 MED ORDER — BUPIVACAINE HCL (PF) 0.25 % IJ SOLN
INTRAMUSCULAR | Status: AC
  Filled 2016-04-15: qty 30

## 2016-04-15 MED ORDER — CEFAZOLIN SODIUM-DEXTROSE 2-4 GM/100ML-% IV SOLN
2.0000 g | INTRAVENOUS | Status: AC
  Administered 2016-04-15: 2 g via INTRAVENOUS

## 2016-04-15 MED ORDER — LACTATED RINGERS IV SOLN
INTRAVENOUS | Status: DC
  Administered 2016-04-15 (×2): via INTRAVENOUS

## 2016-04-15 MED ORDER — OXYCODONE HCL 5 MG PO TABS
ORAL_TABLET | ORAL | Status: AC
  Filled 2016-04-15: qty 1

## 2016-04-15 MED ORDER — FENTANYL CITRATE (PF) 100 MCG/2ML IJ SOLN
25.0000 ug | INTRAMUSCULAR | Status: DC | PRN
  Administered 2016-04-15: 50 ug via INTRAVENOUS

## 2016-04-15 MED ORDER — HYDROCODONE-ACETAMINOPHEN 5-325 MG PO TABS: 1.0000 | ORAL_TABLET | ORAL | 0 refills | Status: DC | PRN

## 2016-04-15 MED ORDER — ONDANSETRON HCL 4 MG/2ML IJ SOLN
INTRAMUSCULAR | Status: DC | PRN
  Administered 2016-04-15: 4 mg via INTRAVENOUS

## 2016-04-15 MED ORDER — EPHEDRINE 5 MG/ML INJ
INTRAVENOUS | Status: AC
  Filled 2016-04-15: qty 40

## 2016-04-15 MED ORDER — BUPIVACAINE HCL (PF) 0.5 % IJ SOLN
INTRAMUSCULAR | Status: AC
  Filled 2016-04-15: qty 30

## 2016-04-15 MED ORDER — OXYCODONE HCL 5 MG PO TABS
5.0000 mg | ORAL_TABLET | Freq: Once | ORAL | Status: AC | PRN
  Administered 2016-04-15: 5 mg via ORAL

## 2016-04-15 MED ORDER — LIDOCAINE HCL (CARDIAC) 20 MG/ML IV SOLN
INTRAVENOUS | Status: DC | PRN
  Administered 2016-04-15: 30 mg via INTRAVENOUS

## 2016-04-15 MED ORDER — DEXAMETHASONE SODIUM PHOSPHATE 4 MG/ML IJ SOLN
INTRAMUSCULAR | Status: DC | PRN
  Administered 2016-04-15: 10 mg via INTRAVENOUS

## 2016-04-15 MED ORDER — BUPIVACAINE HCL (PF) 0.25 % IJ SOLN
INTRAMUSCULAR | Status: DC | PRN
  Administered 2016-04-15: 30 mL

## 2016-04-15 MED ORDER — SCOPOLAMINE 1 MG/3DAYS TD PT72: 1.0000 | MEDICATED_PATCH | Freq: Once | TRANSDERMAL | Status: DC | PRN

## 2016-04-15 MED ORDER — OXYCODONE HCL 5 MG/5ML PO SOLN: 5.0000 mg | Freq: Once | ORAL | Status: AC | PRN

## 2016-04-15 MED ORDER — FENTANYL CITRATE (PF) 100 MCG/2ML IJ SOLN
50.0000 ug | INTRAMUSCULAR | Status: AC | PRN
  Administered 2016-04-15: 25 ug via INTRAVENOUS
  Administered 2016-04-15: 50 ug via INTRAVENOUS
  Administered 2016-04-15: 25 ug via INTRAVENOUS
  Administered 2016-04-15: 50 ug via INTRAVENOUS

## 2016-04-15 MED ORDER — TECHNETIUM TC 99M SULFUR COLLOID FILTERED
1.0000 | Freq: Once | INTRAVENOUS | Status: AC | PRN
  Administered 2016-04-15: 1 via INTRADERMAL

## 2016-04-15 SURGICAL SUPPLY — 42 items
APPLIER CLIP 9.375 MED OPEN (MISCELLANEOUS) ×3
BLADE SURG 15 STRL LF DISP TIS (BLADE) ×1 IMPLANT
BLADE SURG 15 STRL SS (BLADE) ×2
CANISTER SUC SOCK COL 7IN (MISCELLANEOUS) IMPLANT
CANISTER SUCT 1200ML W/VALVE (MISCELLANEOUS) IMPLANT
CHLORAPREP W/TINT 26ML (MISCELLANEOUS) ×3 IMPLANT
CLIP APPLIE 9.375 MED OPEN (MISCELLANEOUS) ×1 IMPLANT
COVER BACK TABLE 60X90IN (DRAPES) ×3 IMPLANT
COVER MAYO STAND STRL (DRAPES) ×3 IMPLANT
COVER PROBE W GEL 5X96 (DRAPES) ×3 IMPLANT
DECANTER SPIKE VIAL GLASS SM (MISCELLANEOUS) IMPLANT
DERMABOND ADVANCED (GAUZE/BANDAGES/DRESSINGS) ×2
DERMABOND ADVANCED .7 DNX12 (GAUZE/BANDAGES/DRESSINGS) ×1 IMPLANT
DEVICE DUBIN W/COMP PLATE 8390 (MISCELLANEOUS) ×3 IMPLANT
DRAPE LAPAROSCOPIC ABDOMINAL (DRAPES) ×3 IMPLANT
DRAPE UTILITY XL STRL (DRAPES) ×3 IMPLANT
ELECT COATED BLADE 2.86 ST (ELECTRODE) ×3 IMPLANT
ELECT REM PT RETURN 9FT ADLT (ELECTROSURGICAL) ×3
ELECTRODE REM PT RTRN 9FT ADLT (ELECTROSURGICAL) ×1 IMPLANT
GLOVE BIO SURGEON STRL SZ7.5 (GLOVE) ×3 IMPLANT
GOWN STRL REUS W/ TWL LRG LVL3 (GOWN DISPOSABLE) ×2 IMPLANT
GOWN STRL REUS W/TWL LRG LVL3 (GOWN DISPOSABLE) ×4
ILLUMINATOR WAVEGUIDE N/F (MISCELLANEOUS) IMPLANT
KIT MARKER MARGIN INK (KITS) ×3 IMPLANT
LIGHT WAVEGUIDE WIDE FLAT (MISCELLANEOUS) ×3 IMPLANT
NDL SAFETY ECLIPSE 18X1.5 (NEEDLE) IMPLANT
NEEDLE HYPO 18GX1.5 SHARP (NEEDLE)
NEEDLE HYPO 25X1 1.5 SAFETY (NEEDLE) ×3 IMPLANT
NS IRRIG 1000ML POUR BTL (IV SOLUTION) IMPLANT
PACK BASIN DAY SURGERY FS (CUSTOM PROCEDURE TRAY) ×3 IMPLANT
PENCIL BUTTON HOLSTER BLD 10FT (ELECTRODE) ×3 IMPLANT
SLEEVE SCD COMPRESS KNEE MED (MISCELLANEOUS) ×3 IMPLANT
SPONGE LAP 18X18 X RAY DECT (DISPOSABLE) ×3 IMPLANT
SUT MON AB 4-0 PC3 18 (SUTURE) ×6 IMPLANT
SUT SILK 2 0 SH (SUTURE) IMPLANT
SUT VICRYL 3-0 CR8 SH (SUTURE) ×3 IMPLANT
SYR CONTROL 10ML LL (SYRINGE) ×3 IMPLANT
TOWEL OR 17X24 6PK STRL BLUE (TOWEL DISPOSABLE) ×3 IMPLANT
TOWEL OR NON WOVEN STRL DISP B (DISPOSABLE) ×3 IMPLANT
TUBE CONNECTING 20'X1/4 (TUBING)
TUBE CONNECTING 20X1/4 (TUBING) IMPLANT
YANKAUER SUCT BULB TIP NO VENT (SUCTIONS) IMPLANT

## 2016-04-15 NOTE — Op Note (Signed)
04/15/2016  11:09 AM  PATIENT:  Bethany Gardner  56 y.o. female  PRE-OPERATIVE DIAGNOSIS:  LEFT BREAST DCIS  POST-OPERATIVE DIAGNOSIS:  LEFT BREAST DCIS  PROCEDURE:  Procedure(s) with comments: LEFT BREAST LUMPECTOMY WITH RADIOACTIVE SEED LOCALIZATION AND SENTINEL LYMPH NODE BIOPSY SURGEON:  Surgeon(s) and Role:    * Jovita Kussmaul, MD - Primary  PHYSICIAN ASSISTANT:   ASSISTANTS: none   ANESTHESIA:   local and general  EBL:  Total I/O In: 1200 [I.V.:1200] Out: -   BLOOD ADMINISTERED:none  DRAINS: none   LOCAL MEDICATIONS USED:  MARCAINE     SPECIMEN:  Source of Specimen:  left breast tissue with additional anterior, lateral, and deep margins and sentinel nodes X 4  DISPOSITION OF SPECIMEN:  PATHOLOGY  COUNTS:  YES  TOURNIQUET:  * No tourniquets in log *  DICTATION: .Dragon Dictation   After informed consent was obtained the patient was brought to the operating room and placed in the supine position on the operating room table. After adequate induction of general anesthesia the patient's left chest, breast, and axillary area were prepped with ChloraPrep, allowed to dry, and draped in usual sterile manner. An appropriate timeout was performed. Previously an I-125 seed was placed in the lower inner left breast to mark an area of ductal carcinoma in situ. Earlier in the day the patient underwent injection of 1 mCi of technetium sulfur colloid in the subareolar position on the left. The neoprobe was initially set to technetium. A hot spot of radioactivity was identified in the left axilla. A small transversely oriented incision was made with a 15 blade knife overlying the hot spot. The incision was carried through the skin and subcutaneous tissue sharply with electrocautery until the axilla was entered. Using the neoprobe to direct blunt hemostat dissection I was able to identify 4 hot lymph nodes. Each of these was excised sharply with the electrocautery and the lymphatics were  controlled with clips. Ex vivo counts on these nodes ranged from 100 to 800. These were sent as sentinel nodes numbers 1 through 4. No other hot or palpable lymph nodes were identified in the left axilla. The area was infiltrated with quarter percent Marcaine. The deep layer of the incision was then closed with interrupted 3-0 Vicryl stitches. The skin was then closed with a running 4-0 Monocryl subcuticular stitch. Attention was then turned to the left breast. The neoprobe was set to I-125 in the area of radioactivity was readily identified in the lower inner left breast. An inframammary fold incision was then made in the lower inner left breast after the area was infiltrated with quarter percent Marcaine. The incision was carried through the skin and subcutaneous tissue sharply with electrocautery. The dissection was then carried out towards the radioactive seed using the neoprobe to direct the dissection. Once the seed was approached more closely then a circular portion of breast tissue was excised sharply around the radioactive seed while checking the area of radioactivity frequently. Once the specimen was removed it was oriented with the appropriate paint colors. A specimen radiograph was obtained that showed the clip and seed to be near the center of the specimen. I did decide to remove additional anterior, lateral, and inferior margins. This was done sharply with the electrocautery and the margins were marked appropriately. Hemostasis was then achieved using the Bovie electrocautery. The wound was irrigated with saline and infiltrated with quarter percent Marcaine. The deep layer of the wound was then closed with layers of interrupted 3-0  Vicryl stitches. The skin was then closed with interrupted 4-0 Monocryl subcuticular stitches. Dermabond dressings were applied. The patient tolerated the procedure well. At the end of the case all needle sponge and instrument counts were correct. The patient was then  awakened and taken to recovery in stable condition.  PLAN OF CARE: Discharge to home after PACU  PATIENT DISPOSITION:  PACU - hemodynamically stable.   Delay start of Pharmacological VTE agent (>24hrs) due to surgical blood loss or risk of bleeding: not applicable

## 2016-04-15 NOTE — Anesthesia Procedure Notes (Addendum)
Anesthesia Regional Block:  Pectoralis block  Pre-Anesthetic Checklist: ,, timeout performed, Correct Patient, Correct Site, Correct Laterality, Correct Procedure, Correct Position, site marked, Risks and benefits discussed,  Surgical consent,  Pre-op evaluation,  At surgeon's request and post-op pain management  Laterality: Left  Prep: chloraprep       Needles:  Injection technique: Single-shot  Needle Type: Echogenic Needle     Needle Length: 9cm 9 cm Needle Gauge: 22 and 22 G    Additional Needles:  Procedures: ultrasound guided (picture in chart) Pectoralis block Narrative:  Start time: 04/15/2016 9:00 AM End time: 04/15/2016 9:05 AM Injection made incrementally with aspirations every 5 mL.  Performed by: Personally   Additional Notes: 20 cc 0.75% Naropin injected easily

## 2016-04-15 NOTE — Discharge Instructions (Signed)

## 2016-04-15 NOTE — Interval H&P Note (Signed)
History and Physical Interval Note:  04/15/2016 9:15 AM  Bethany Gardner  has presented today for surgery, with the diagnosis of LEFT BREAST DCIS  The various methods of treatment have been discussed with the patient and family. After consideration of risks, benefits and other options for treatment, the patient has consented to  Procedure(s): BREAST LUMPECTOMY WITH RADIOACTIVE SEED AND SENTINEL LYMPH NODE BIOPSY (Left) as a surgical intervention .  The patient's history has been reviewed, patient examined, no change in status, stable for surgery.  I have reviewed the patient's chart and labs.  Questions were answered to the patient's satisfaction.     TOTH III,PAUL S

## 2016-04-15 NOTE — H&P (Signed)
Bethany Gardner  Location: [facility] Patient #: [id number] DOB: 13-Nov-1960 Undefined / Language: Cleophus Molt / Race: White Female   History of Present Illness  The patient is a 56 year old female who presents with breast cancer. We are asked to see the patient in consultation by Dr. Lisbeth Renshaw to evaluate her for a new left breast cancer. The patient is a 56yo white female who recently went for a routine screening mammogram. At that time she was found to have an area of calcification in the lower inner quadrant that measured 2.1 cm. It was biopsied and came back as high grade dcis. The pathologist was concerned there could be some invasion but there was nothing diagnostic. It was ER+. She does not smoke and has no family history of breast cancer.   Past Surgical History  Breast Biopsy  Left. Hysterectomy (not due to cancer) - Partial  Oral Surgery  Tonsillectomy   Diagnostic Studies History Colonoscopy  within last year Mammogram  within last year Pap Smear  >5 years ago  Medication History  Medications Reconciled  Social History  Alcohol use  Moderate alcohol use. Caffeine use  Coffee. No drug use  Tobacco use  Never smoker.  Family History  Cancer  Family Members In General. Heart Disease  Family Members In General, Father. Heart disease in female family member before age 16  Melanoma  Sister.  Pregnancy / Birth History Age at menarche  43 years. Age of menopause  70-50 Contraceptive History  Oral contraceptives. Gravida  9 Irregular periods  Length (months) of breastfeeding  3-6 Maternal age  87-30 Para  1  Other Problems  Breast Cancer  Lump In Breast     Review of Systems General Present- Weight Gain. Not Present- Appetite Loss, Chills, Fatigue, Fever, Night Sweats and Weight Loss. Skin Not Present- Change in Wart/Mole, Dryness, Hives, Jaundice, New Lesions, Non-Healing Wounds, Rash and Ulcer. HEENT Present- Ringing in the Ears  and Wears glasses/contact lenses. Not Present- Earache, Hearing Loss, Hoarseness, Nose Bleed, Oral Ulcers, Seasonal Allergies, Sinus Pain, Sore Throat, Visual Disturbances and Yellow Eyes. Respiratory Not Present- Bloody sputum, Chronic Cough, Difficulty Breathing, Snoring and Wheezing. Breast Not Present- Breast Mass, Breast Pain, Nipple Discharge and Skin Changes. Cardiovascular Not Present- Chest Pain, Difficulty Breathing Lying Down, Leg Cramps, Palpitations, Rapid Heart Rate, Shortness of Breath and Swelling of Extremities. Gastrointestinal Not Present- Abdominal Pain, Bloating, Bloody Stool, Change in Bowel Habits, Chronic diarrhea, Constipation, Difficulty Swallowing, Excessive gas, Gets full quickly at meals, Hemorrhoids, Indigestion, Nausea, Rectal Pain and Vomiting. Female Genitourinary Not Present- Frequency, Nocturia, Painful Urination, Pelvic Pain and Urgency. Musculoskeletal Present- Joint Stiffness. Not Present- Back Pain, Joint Pain, Muscle Pain, Muscle Weakness and Swelling of Extremities. Neurological Present- Trouble walking. Not Present- Decreased Memory, Fainting, Headaches, Numbness, Seizures, Tingling, Tremor and Weakness. Psychiatric Not Present- Anxiety, Bipolar, Change in Sleep Pattern, Depression, Fearful and Frequent crying. Endocrine Present- Hair Changes. Not Present- Cold Intolerance, Excessive Hunger, Heat Intolerance, Hot flashes and New Diabetes. Hematology Not Present- Blood Thinners, Easy Bruising, Excessive bleeding, Gland problems, HIV and Persistent Infections.   Physical Exam  General Mental Status-Alert. General Appearance-Consistent with stated age. Hydration-Well hydrated. Voice-Normal.  Head and Neck Head-normocephalic, atraumatic with no lesions or palpable masses. Trachea-midline. Thyroid Gland Characteristics - normal size and consistency.  Eye Eyeball - Bilateral-Extraocular movements intact. Sclera/Conjunctiva - Bilateral-No  scleral icterus.  Chest and Lung Exam Chest and lung exam reveals -quiet, even and easy respiratory effort with no use of accessory  muscles and on auscultation, normal breath sounds, no adventitious sounds and normal vocal resonance. Inspection Chest Wall - Normal. Back - normal.  Breast Note: There is no palpable mass in either breast. There is no palpable axillary, supraclavicular, or cervical lymphadenopathy   Cardiovascular Cardiovascular examination reveals -normal heart sounds, regular rate and rhythm with no murmurs and normal pedal pulses bilaterally.  Abdomen Inspection Inspection of the abdomen reveals - No Hernias. Skin - Scar - no surgical scars. Palpation/Percussion Palpation and Percussion of the abdomen reveal - Soft, Non Tender, No Rebound tenderness, No Rigidity (guarding) and No hepatosplenomegaly. Auscultation Auscultation of the abdomen reveals - Bowel sounds normal.  Neurologic Neurologic evaluation reveals -alert and oriented x 3 with no impairment of recent or remote memory. Mental Status-Normal.  Musculoskeletal Normal Exam - Left-Upper Extremity Strength Normal and Lower Extremity Strength Normal. Normal Exam - Right-Upper Extremity Strength Normal and Lower Extremity Strength Normal.  Lymphatic Head & Neck  General Head & Neck Lymphatics: Bilateral - Description - Normal. Axillary  General Axillary Region: Bilateral - Description - Normal. Tenderness - Non Tender. Femoral & Inguinal  Generalized Femoral & Inguinal Lymphatics: Bilateral - Description - Normal. Tenderness - Non Tender.    Assessment & Plan  DUCTAL CARCINOMA IN SITU (DCIS) OF LEFT BREAST (D05.12) Impression: The patient appears to have a small area of dcis in the lower inner left breast. I have talked to her in detail about the different options for treatment and at this point she favors breast conservation. Given the pathology suspicion I would recommend a sentinel  node mapping at the time of surgery. I have discussed with her in detail the risks and benefits of the surgery as well as some of the technical aspects and she understands and wishes to proceed.

## 2016-04-15 NOTE — Anesthesia Postprocedure Evaluation (Signed)
Anesthesia Post Note  Patient: Bethany Gardner  Procedure(s) Performed: Procedure(s) (LRB): BREAST LUMPECTOMY WITH RADIOACTIVE SEED AND SENTINEL LYMPH NODE BIOPSY (Left)  Patient location during evaluation: PACU Anesthesia Type: General and Regional Level of consciousness: awake and alert Pain management: pain level controlled Vital Signs Assessment: post-procedure vital signs reviewed and stable Respiratory status: spontaneous breathing, nonlabored ventilation and respiratory function stable Cardiovascular status: blood pressure returned to baseline and stable Postop Assessment: no signs of nausea or vomiting Anesthetic complications: no       Last Vitals:  Vitals:   04/15/16 1215 04/15/16 1230  BP: 138/73 139/73  Pulse: 100 (!) 101  Resp: 13 11  Temp:      Last Pain:  Vitals:   04/15/16 1230  TempSrc:   PainSc: 2                  Vondell Sowell,W. EDMOND

## 2016-04-15 NOTE — Anesthesia Procedure Notes (Signed)
Procedure Name: LMA Insertion Date/Time: 04/15/2016 9:33 AM Performed by: Kiyon Fidalgo D Pre-anesthesia Checklist: Patient identified, Emergency Drugs available, Suction available and Patient being monitored Patient Re-evaluated:Patient Re-evaluated prior to inductionOxygen Delivery Method: Circle system utilized Preoxygenation: Pre-oxygenation with 100% oxygen Intubation Type: IV induction Ventilation: Mask ventilation without difficulty LMA: LMA inserted LMA Size: 4.0 Number of attempts: 1 Airway Equipment and Method: Bite block Placement Confirmation: positive ETCO2 Tube secured with: Tape Dental Injury: Teeth and Oropharynx as per pre-operative assessment

## 2016-04-15 NOTE — Transfer of Care (Signed)
Immediate Anesthesia Transfer of Care Note  Patient: Bethany Gardner  Procedure(s) Performed: Procedure(s) with comments: BREAST LUMPECTOMY WITH RADIOACTIVE SEED AND SENTINEL LYMPH NODE BIOPSY (Left) - BREAST LUMPECTOMY WITH RADIOACTIVE SEED AND SENTINEL LYMPH NODE BIOPSY  Patient Location: PACU  Anesthesia Type:GA combined with regional for post-op pain  Level of Consciousness: awake and patient cooperative  Airway & Oxygen Therapy: Patient Spontanous Breathing and Patient connected to face mask oxygen  Post-op Assessment: Report given to RN and Post -op Vital signs reviewed and stable  Post vital signs: Reviewed and stable  Last Vitals:  Vitals:   04/15/16 0910 04/15/16 0915  BP:  104/71  Pulse: 75 75  Resp: 11 12  Temp:      Last Pain:  Vitals:   04/15/16 0756  TempSrc: Oral         Complications: No apparent anesthesia complications

## 2016-04-15 NOTE — Anesthesia Preprocedure Evaluation (Addendum)
Anesthesia Evaluation  Patient identified by MRN, date of birth, ID band Patient awake    Reviewed: Allergy & Precautions, NPO status , Patient's Chart, lab work & pertinent test results  Airway Mallampati: II  TM Distance: >3 FB Neck ROM: Full    Dental  (+) Teeth Intact, Dental Advisory Given   Pulmonary    breath sounds clear to auscultation       Cardiovascular  Rhythm:Regular Rate:Normal     Neuro/Psych    GI/Hepatic   Endo/Other    Renal/GU      Musculoskeletal   Abdominal   Peds  Hematology   Anesthesia Other Findings   Reproductive/Obstetrics                            Anesthesia Physical Anesthesia Plan  ASA: II  Anesthesia Plan: General   Post-op Pain Management:    Induction: Intravenous  Airway Management Planned: LMA  Additional Equipment:   Intra-op Plan:   Post-operative Plan:   Informed Consent: I have reviewed the patients History and Physical, chart, labs and discussed the procedure including the risks, benefits and alternatives for the proposed anesthesia with the patient or authorized representative who has indicated his/her understanding and acceptance.   Dental advisory given  Plan Discussed with: CRNA and Anesthesiologist  Anesthesia Plan Comments:         Anesthesia Quick Evaluation  

## 2016-04-15 NOTE — Progress Notes (Signed)
Assisted Dr. Joslin with left, ultrasound guided, pectoralis block. Side rails up, monitors on throughout procedure. See vital signs in flow sheet. Tolerated Procedure well. 

## 2016-04-18 ENCOUNTER — Encounter (HOSPITAL_BASED_OUTPATIENT_CLINIC_OR_DEPARTMENT_OTHER): Payer: Self-pay | Admitting: General Surgery

## 2016-04-27 ENCOUNTER — Ambulatory Visit
Admission: RE | Admit: 2016-04-27 | Discharge: 2016-04-27 | Disposition: A | Discharge status: Home / Self Care | Payer: Managed Care, Other (non HMO) | Source: Ambulatory Visit | Attending: Radiation Oncology | Admitting: Radiation Oncology

## 2016-04-27 ENCOUNTER — Ambulatory Visit: Payer: Managed Care, Other (non HMO)

## 2016-04-27 DIAGNOSIS — Z803 Family history of malignant neoplasm of breast: Secondary | ICD-10-CM | POA: Insufficient documentation

## 2016-04-27 DIAGNOSIS — Z808 Family history of malignant neoplasm of other organs or systems: Secondary | ICD-10-CM | POA: Insufficient documentation

## 2016-04-27 DIAGNOSIS — Z6830 Body mass index (BMI) 30.0-30.9, adult: Secondary | ICD-10-CM | POA: Insufficient documentation

## 2016-04-27 DIAGNOSIS — Z510 Encounter for antineoplastic radiation therapy: Secondary | ICD-10-CM | POA: Insufficient documentation

## 2016-04-27 DIAGNOSIS — C50312 Malignant neoplasm of lower-inner quadrant of left female breast: Secondary | ICD-10-CM | POA: Insufficient documentation

## 2016-04-27 DIAGNOSIS — Z170 Estrogen receptor positive status [ER+]: Secondary | ICD-10-CM | POA: Insufficient documentation

## 2016-04-27 DIAGNOSIS — Z8249 Family history of ischemic heart disease and other diseases of the circulatory system: Secondary | ICD-10-CM | POA: Insufficient documentation

## 2016-04-27 DIAGNOSIS — E669 Obesity, unspecified: Secondary | ICD-10-CM | POA: Insufficient documentation

## 2016-04-27 DIAGNOSIS — Z823 Family history of stroke: Secondary | ICD-10-CM | POA: Insufficient documentation

## 2016-05-04 ENCOUNTER — Encounter (HOSPITAL_COMMUNITY): Payer: Self-pay | Admitting: General Practice

## 2016-05-04 ENCOUNTER — Ambulatory Visit: Payer: Managed Care, Other (non HMO)

## 2016-05-04 ENCOUNTER — Inpatient Hospital Stay (HOSPITAL_COMMUNITY): Payer: Managed Care, Other (non HMO)

## 2016-05-04 ENCOUNTER — Inpatient Hospital Stay (HOSPITAL_COMMUNITY)
Admission: AD | Admit: 2016-05-04 | Discharge: 2016-05-06 | DRG: 863 | Disposition: A | Discharge status: Home / Self Care | Payer: Managed Care, Other (non HMO) | Source: Ambulatory Visit | Attending: General Surgery | Admitting: General Surgery

## 2016-05-04 ENCOUNTER — Ambulatory Visit
Admission: RE | Admit: 2016-05-04 | Payer: Managed Care, Other (non HMO) | Source: Ambulatory Visit | Admitting: Radiation Oncology

## 2016-05-04 ENCOUNTER — Ambulatory Visit: Payer: Self-pay | Admitting: General Surgery

## 2016-05-04 DIAGNOSIS — L7634 Postprocedural seroma of skin and subcutaneous tissue following other procedure: Secondary | ICD-10-CM | POA: Diagnosis present

## 2016-05-04 DIAGNOSIS — L02412 Cutaneous abscess of left axilla: Secondary | ICD-10-CM

## 2016-05-04 DIAGNOSIS — L03112 Cellulitis of left axilla: Secondary | ICD-10-CM | POA: Diagnosis present

## 2016-05-04 DIAGNOSIS — Z808 Family history of malignant neoplasm of other organs or systems: Secondary | ICD-10-CM

## 2016-05-04 DIAGNOSIS — Z803 Family history of malignant neoplasm of breast: Secondary | ICD-10-CM

## 2016-05-04 DIAGNOSIS — D0512 Intraductal carcinoma in situ of left breast: Secondary | ICD-10-CM | POA: Diagnosis present

## 2016-05-04 DIAGNOSIS — Y92009 Unspecified place in unspecified non-institutional (private) residence as the place of occurrence of the external cause: Secondary | ICD-10-CM | POA: Diagnosis not present

## 2016-05-04 DIAGNOSIS — T814XXA Infection following a procedure, initial encounter: Principal | ICD-10-CM | POA: Diagnosis present

## 2016-05-04 DIAGNOSIS — Z90710 Acquired absence of both cervix and uterus: Secondary | ICD-10-CM | POA: Diagnosis not present

## 2016-05-04 LAB — PROTIME-INR
INR: 1.02
Prothrombin Time: 13.4 seconds (ref 11.4–15.2)

## 2016-05-04 LAB — CBC WITH DIFFERENTIAL/PLATELET
BASOS ABS: 0 10*3/uL (ref 0.0–0.1)
Basophils Relative: 0 %
EOS PCT: 2 %
Eosinophils Absolute: 0.1 10*3/uL (ref 0.0–0.7)
HCT: 35.1 % — ABNORMAL LOW (ref 36.0–46.0)
Hemoglobin: 12.1 g/dL (ref 12.0–15.0)
LYMPHS ABS: 1.5 10*3/uL (ref 0.7–4.0)
LYMPHS PCT: 27 %
MCH: 31.7 pg (ref 26.0–34.0)
MCHC: 34.5 g/dL (ref 30.0–36.0)
MCV: 91.9 fL (ref 78.0–100.0)
MONO ABS: 0.4 10*3/uL (ref 0.1–1.0)
Monocytes Relative: 7 %
Neutro Abs: 3.5 10*3/uL (ref 1.7–7.7)
Neutrophils Relative %: 64 %
PLATELETS: 267 10*3/uL (ref 150–400)
RBC: 3.82 MIL/uL — ABNORMAL LOW (ref 3.87–5.11)
RDW: 12.4 % (ref 11.5–15.5)
WBC: 5.5 10*3/uL (ref 4.0–10.5)

## 2016-05-04 LAB — BASIC METABOLIC PANEL
Anion gap: 7 (ref 5–15)
BUN: 9 mg/dL (ref 6–20)
CHLORIDE: 107 mmol/L (ref 101–111)
CO2: 27 mmol/L (ref 22–32)
CREATININE: 0.67 mg/dL (ref 0.44–1.00)
Calcium: 9.2 mg/dL (ref 8.9–10.3)
GFR calc non Af Amer: >60 mL/min (ref >60)
Glucose, Bld: 131 mg/dL — ABNORMAL HIGH (ref 65–99)
Potassium: 3.9 mmol/L (ref 3.5–5.1)
Sodium: 141 mmol/L (ref 135–145)

## 2016-05-04 MED ORDER — MORPHINE SULFATE (PF) 2 MG/ML IV SOLN
1.0000 mg | INTRAVENOUS | Status: DC | PRN
  Administered 2016-05-04 (×2): 2 mg via INTRAVENOUS
  Filled 2016-05-04 (×2): qty 1

## 2016-05-04 MED ORDER — DIPHENHYDRAMINE HCL 50 MG/ML IJ SOLN
25.0000 mg | Freq: Four times a day (QID) | INTRAMUSCULAR | Status: DC | PRN
  Administered 2016-05-04: 25 mg via INTRAVENOUS
  Filled 2016-05-04: qty 1

## 2016-05-04 MED ORDER — HEPARIN SODIUM (PORCINE) 5000 UNIT/ML IJ SOLN
5000.0000 [IU] | Freq: Three times a day (TID) | INTRAMUSCULAR | Status: DC
  Administered 2016-05-05 – 2016-05-06 (×4): 5000 [IU] via SUBCUTANEOUS
  Filled 2016-05-04 (×4): qty 1

## 2016-05-04 MED ORDER — DIPHENHYDRAMINE HCL 25 MG PO CAPS: 25.0000 mg | ORAL_CAPSULE | Freq: Four times a day (QID) | ORAL | Status: DC | PRN

## 2016-05-04 MED ORDER — HEPARIN SODIUM (PORCINE) 5000 UNIT/ML IJ SOLN: 5000.0000 [IU] | Freq: Three times a day (TID) | INTRAMUSCULAR | Status: DC

## 2016-05-04 MED ORDER — IBUPROFEN 600 MG PO TABS
600.0000 mg | ORAL_TABLET | Freq: Four times a day (QID) | ORAL | Status: DC | PRN
  Administered 2016-05-05: 600 mg via ORAL
  Filled 2016-05-04: qty 1

## 2016-05-04 MED ORDER — VANCOMYCIN HCL 10 G IV SOLR
1500.0000 mg | Freq: Once | INTRAVENOUS | Status: AC
  Administered 2016-05-04: 1500 mg via INTRAVENOUS
  Filled 2016-05-04 (×2): qty 1500

## 2016-05-04 MED ORDER — PANTOPRAZOLE SODIUM 40 MG IV SOLR
40.0000 mg | Freq: Every day | INTRAVENOUS | Status: DC
  Administered 2016-05-04: 40 mg via INTRAVENOUS
  Filled 2016-05-04: qty 40

## 2016-05-04 MED ORDER — ONDANSETRON 4 MG PO TBDP: 4.0000 mg | ORAL_TABLET | Freq: Four times a day (QID) | ORAL | Status: AC | PRN

## 2016-05-04 MED ORDER — ONDANSETRON 4 MG PO TBDP: 4.0000 mg | ORAL_TABLET | Freq: Four times a day (QID) | ORAL | Status: DC | PRN

## 2016-05-04 MED ORDER — KCL IN DEXTROSE-NACL 20-5-0.9 MEQ/L-%-% IV SOLN
INTRAVENOUS | Status: DC
  Administered 2016-05-04 – 2016-05-05 (×2): via INTRAVENOUS
  Filled 2016-05-04 (×2): qty 1000

## 2016-05-04 MED ORDER — SODIUM CHLORIDE 0.9 % IV SOLN: 4.0000 mg | Freq: Four times a day (QID) | INTRAVENOUS | Status: AC | PRN

## 2016-05-04 MED ORDER — PANTOPRAZOLE SODIUM 40 MG IV SOLR: 40.0000 mg | Freq: Every day | INTRAVENOUS | Status: AC

## 2016-05-04 MED ORDER — PIPERACILLIN-TAZOBACTAM 3.375 G IVPB
3.3750 g | Freq: Three times a day (TID) | INTRAVENOUS | Status: DC
  Administered 2016-05-04 – 2016-05-06 (×5): 3.375 g via INTRAVENOUS
  Filled 2016-05-04 (×6): qty 50

## 2016-05-04 MED ORDER — VANCOMYCIN HCL 10 G IV SOLR
1250.0000 mg | Freq: Two times a day (BID) | INTRAVENOUS | Status: DC
  Administered 2016-05-05 – 2016-05-06 (×3): 1250 mg via INTRAVENOUS
  Filled 2016-05-04 (×4): qty 1250

## 2016-05-04 MED ORDER — TRAMADOL HCL 50 MG PO TABS
50.0000 mg | ORAL_TABLET | Freq: Every day | ORAL | Status: DC | PRN
  Administered 2016-05-05: 50 mg via ORAL
  Filled 2016-05-04: qty 1

## 2016-05-04 MED ORDER — METHOCARBAMOL 500 MG PO TABS: 500.0000 mg | ORAL_TABLET | Freq: Four times a day (QID) | ORAL | Status: DC | PRN

## 2016-05-04 MED ORDER — HEPARIN SODIUM (PORCINE) 5000 UNIT/ML IJ SOLN: 5000.0000 [IU] | Freq: Three times a day (TID) | INTRAMUSCULAR | Status: AC

## 2016-05-04 MED ORDER — HYDROCODONE-ACETAMINOPHEN 5-325 MG PO TABS: 1.0000 | ORAL_TABLET | ORAL | Status: AC | PRN

## 2016-05-04 MED ORDER — KCL IN DEXTROSE-NACL 20-5-0.9 MEQ/L-%-% IV SOLN: INTRAVENOUS | Status: AC

## 2016-05-04 MED ORDER — TAMOXIFEN CITRATE 10 MG PO TABS
20.0000 mg | ORAL_TABLET | Freq: Every day | ORAL | Status: DC
  Administered 2016-05-04 – 2016-05-06 (×3): 20 mg via ORAL
  Filled 2016-05-04 (×3): qty 2

## 2016-05-04 MED ORDER — PIPERACILLIN-TAZOBACTAM 3.375 G IVPB 30 MIN
3.3750 g | Freq: Once | INTRAVENOUS | Status: AC
  Administered 2016-05-04: 3.375 g via INTRAVENOUS
  Filled 2016-05-04 (×2): qty 50

## 2016-05-04 MED ORDER — HYDROCODONE-ACETAMINOPHEN 5-325 MG PO TABS
1.0000 | ORAL_TABLET | ORAL | Status: DC | PRN
  Administered 2016-05-04 (×2): 1 via ORAL
  Administered 2016-05-05: 2 via ORAL
  Administered 2016-05-05 – 2016-05-06 (×4): 1 via ORAL
  Filled 2016-05-04 (×2): qty 2
  Filled 2016-05-04: qty 1
  Filled 2016-05-04: qty 2
  Filled 2016-05-04: qty 1
  Filled 2016-05-04: qty 2

## 2016-05-04 MED ORDER — ONDANSETRON HCL 4 MG/2ML IJ SOLN: 4.0000 mg | Freq: Four times a day (QID) | INTRAMUSCULAR | Status: DC | PRN

## 2016-05-04 MED ORDER — LIDOCAINE HCL (PF) 1 % IJ SOLN
INTRAMUSCULAR | Status: AC
  Filled 2016-05-04: qty 10

## 2016-05-04 NOTE — H&P (Signed)
Bethany Gardner is an 56 y.o. female.   Chief Complaint: redness HPI: The patient is a 56 year old white female who is about 2 weeks status post left breast lumpectomy and sentinel node mapping for ductal carcinoma in situ. She was doing well for about 2 weeks until she decided to shovel [place]. After doing this she developed some pain and redness around the left axillary incision. She was started on Keflex but had no improvement in the redness. I aspirated the fluid in the clinic and it was clear and yellow with no sign of infection. Cultures were obtained which are still pending. She was switched to doxycycline to cover resistant staph but has had increasing redness. She will be admitted today for IV antibiotics and possible aspiration of the seroma cavity.  Past Medical History:  Diagnosis Date  . Anemia    father has Factor V Leiden-no issues with it. Pt has not been tested  . Endometriosis   . Fibroid   . Foot pain    bilateral - occ foot pain  . Infertility, female   . Malignant neoplasm of lower-inner quadrant of left female breast ([place]) 03/16/2016   left breast DCIS  . Obesity     Past Surgical History:  Procedure Laterality Date  . ABDOMINAL HYSTERECTOMY    . BREAST BIOPSY Left    benign  . BREAST LUMPECTOMY WITH RADIOACTIVE SEED AND SENTINEL LYMPH NODE BIOPSY Left 04/15/2016   Procedure: BREAST LUMPECTOMY WITH RADIOACTIVE SEED AND SENTINEL LYMPH NODE BIOPSY;  Surgeon: Autumn Messing III, MD;  Location: [facility];  Service: General;  Laterality: Left;  BREAST LUMPECTOMY WITH RADIOACTIVE SEED AND SENTINEL LYMPH NODE BIOPSY  . DILATION AND CURETTAGE OF UTERUS    . laproscopy     three  . TONSILLECTOMY      Family History  Problem Relation Age of Onset  . Heart disease Father   . Heart attack Father 63  . Transient ischemic attack Mother   . Stroke Mother   . Breast cancer Maternal Grandmother   . Melanoma Sister   . Colon cancer Neg Hx   . Colon polyps Neg Hx   .  Esophageal cancer Neg Hx   . Rectal cancer Neg Hx   . Stomach cancer Neg Hx    Social History:  reports that she has never smoked. She has never used smokeless tobacco. She reports that she drinks about 4.2 oz of alcohol per week . She reports that she does not use drugs.  Allergies: No Known Allergies  Medications Prior to Admission  Medication Sig Dispense Refill  . doxycycline (VIBRA-TABS) 100 MG tablet Take 100 mg by mouth 2 (two) times daily.    Marland Kitchen ibuprofen (ADVIL,MOTRIN) 200 MG tablet Take 200-400 mg by mouth every 6 (six) hours as needed for pain.     . tamoxifen (NOLVADEX) 20 MG tablet Take 1 tablet (20 mg total) by mouth daily. (Patient taking differently: Take 20 mg by mouth at bedtime. ) 30 tablet 2  . traMADol (ULTRAM) 50 MG tablet Take 50 mg by mouth daily as needed for moderate pain.     Marland Kitchen HYDROcodone-acetaminophen (NORCO/VICODIN) 5-325 MG tablet Take 1-2 tablets by mouth every 4 (four) hours as needed for moderate pain or severe pain. (Patient not taking: Reported on 05/04/2016) 20 tablet 0    No results found for this or any previous visit (from the past 48 hour(s)). No results found.  Review of Systems  Constitutional: Negative.   HENT:  Negative.   Eyes: Negative.   Respiratory: Negative.   Cardiovascular: Negative.   Gastrointestinal: Negative.   Genitourinary: Negative.   Musculoskeletal: Negative.   Skin: Negative.   Neurological: Negative.   Endo/Heme/Allergies: Negative.   Psychiatric/Behavioral: Negative.     Blood pressure 110/65, pulse 92, temperature 98.2 F (36.8 C), temperature source Oral, resp. rate 17, height 5\' 6"  (1.676 m), weight 90.6 kg (199 lb 11.8 oz), SpO2 100 %. Physical Exam  Constitutional: She is oriented to person, place, and time. She appears well-developed and well-nourished.  HENT:  Head: Normocephalic and atraumatic.  Eyes: Conjunctivae and EOM are normal. Pupils are equal, round, and reactive to light.  Neck: Normal range of  motion. Neck supple.  Cardiovascular: Normal rate, regular rhythm and normal heart sounds.   Respiratory: Effort normal and breath sounds normal.  GI: Soft. Bowel sounds are normal.  Musculoskeletal: Normal range of motion.  There is swelling and redness surrounding the left axillary incision  Neurological: She is alert and oriented to person, place, and time.  Skin: Skin is warm and dry.  Psychiatric: She has a normal mood and affect. Her behavior is normal.    ASSESSMENT The patient appears to have a seroma in the left axilla with some surrounding cellulitis. I will plan to admit her for IV vancomycin. I will consult interventional radiology to have the seroma cavity aspirated.   Merrie Roof, MD  05/04/2016, 12:28 PM

## 2016-05-04 NOTE — Progress Notes (Signed)
Pharmacy Antibiotic Note  Bethany Gardner is a 56 y.o. female admitted on 05/04/2016 with redness on left breast.  Patient has a recent history of left breast lumpectomy for ductal carcinoma.  Pharmacy has been consulted for vancomycin and Zosyn dosing for cellulitis that did not respond on oral Keflex nor doxycycline.  Baseline labs pending collection.   Plan: - Vanc 1500mg  IV x 1 - Zosyn 3.375gm IV x 1 - F/U baseline labs for further dosing   Height: 5\' 6"  (167.6 cm) Weight: 199 lb 11.8 oz (90.6 kg) IBW/kg (Calculated) : 59.3  Temp (24hrs), Avg:98.2 F (36.8 C), Min:98.2 F (36.8 C), Max:98.2 F (36.8 C)  No results for input(s): WBC, CREATININE, LATICACIDVEN, VANCOTROUGH, VANCOPEAK, VANCORANDOM, GENTTROUGH, GENTPEAK, GENTRANDOM, TOBRATROUGH, TOBRAPEAK, TOBRARND, AMIKACINPEAK, AMIKACINTROU, AMIKACIN in the last 168 hours.  CrCl cannot be calculated (Patient's most recent lab result is older than the maximum 21 days allowed.).    No Known Allergies  Antimicrobials this admission: Vanc 1/24 >> Zosyn 1/24 >>  Dose adjustments this admission: N/A  Microbiology results: N/A   Angus Amini D. Mina Marble, PharmD, BCPS Pager:  [phone] 05/04/2016, 12:51 PM

## 2016-05-04 NOTE — Progress Notes (Signed)
   05/04/16 1655  Vitals  Temp 98.2 F (36.8 C)  Temp Source Oral  BP 126/71  BP Location Left Arm  BP Method Automatic  Patient Position (if appropriate) Lying  Pulse Rate 87  Pulse Rate Source Dinamap  Oxygen Therapy  SpO2 100 %  O2 Device Room Air  Pt reports itching and hives while receiving iv vancomycin and zosyn. Redness to abdomen and upper back noted. MD noted

## 2016-05-04 NOTE — Consult Note (Signed)
Chief Complaint: Patient was seen in consultation today for left axillary seroma aspiration at the request of Dr Fredrik Cove  Referring Physician(s): Dr Fredrik Cove  Supervising Physician: Arne Cleveland  Patient Status: [facility] - In-pt  History of Present Illness: Bethany Gardner is a 56 y.o. 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    Past Medical History:  Diagnosis Date  . Anemia    father has Factor V Leiden-no issues with it. Pt has not been tested  . Endometriosis   . Fibroid   . Foot pain    bilateral - occ foot pain  . Infertility, female   . 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 Obesity     Past Surgical History:  Procedure Laterality Date  . ABDOMINAL HYSTERECTOMY    . BREAST BIOPSY Left    benign  . BREAST LUMPECTOMY WITH RADIOACTIVE SEED AND SENTINEL LYMPH NODE BIOPSY Left 04/15/2016   Procedure: BREAST LUMPECTOMY WITH RADIOACTIVE SEED AND SENTINEL LYMPH NODE BIOPSY;  Surgeon: Autumn Messing III, MD;  Location: [facility];  Service: General;  Laterality: Left;  BREAST LUMPECTOMY WITH RADIOACTIVE SEED AND SENTINEL LYMPH NODE BIOPSY  . DILATION AND CURETTAGE OF UTERUS    . laproscopy     three  . TONSILLECTOMY      Allergies: Patient has no known allergies.  Medications: Prior to Admission medications   Medication Sig Start Date End Date Taking? Authorizing Provider  doxycycline (VIBRA-TABS) 100 MG tablet Take 100 mg by mouth  2 (two) times daily. 05/03/16  Yes Historical Provider, MD  ibuprofen (ADVIL,MOTRIN) 200 MG tablet Take 200-400 mg by mouth every 6 (six) hours as needed for pain.    Yes Historical Provider, MD  tamoxifen (NOLVADEX) 20 MG tablet Take 1 tablet (20 mg total) by mouth daily. Patient taking differently: Take 20 mg by mouth at bedtime.  03/24/16  Yes Chauncey Cruel, MD  traMADol (ULTRAM) 50 MG tablet Take 50 mg by mouth daily as needed for moderate pain.  04/30/16  Yes Historical Provider, MD  HYDROcodone-acetaminophen (NORCO/VICODIN) 5-325 MG tablet Take 1-2 tablets by mouth every 4 (four) hours as needed for moderate pain or severe pain. Patient not taking: Reported on 05/04/2016 04/15/16   Autumn Messing III, MD     Family History  Problem Relation Age of Onset  . Heart disease Father   . Heart attack Father 60  . Transient ischemic attack Mother   . Stroke Mother   . Breast cancer Maternal Grandmother   . Melanoma Sister   . Colon cancer Neg Hx   . Colon polyps Neg Hx   . Esophageal cancer Neg Hx   . Rectal cancer Neg Hx   . Stomach cancer Neg Hx     Social History   Social History  . Marital status: Married    Spouse name: N/A  . Number of children: N/A  . Years of education: N/A   Social  History Main Topics  . Smoking status: Never Smoker  . Smokeless tobacco: Never Used  . Alcohol use 4.2 oz/week    4 Glasses of wine, 3 Standard drinks or equivalent per week     Comment: occasionally  . Drug use: No  . Sexual activity: Yes    Partners: Male    Birth control/ protection: Surgical     Comment: Hysterectomy   Other Topics Concern  . None   Social History Narrative   Work or School: homemaker      Home Situation: lives with husband and son in [place] (also has a daughter)      Spiritual Beliefs:  Christian      Lifestyle: restarting a healthy diet and exercise in 2016          Review of Systems: A 12 point ROS discussed and pertinent positives are indicated in  the HPI above.  All other systems are negative.  Review of Systems  Constitutional: Positive for activity change and fever. Negative for appetite change and fatigue.  Respiratory: Negative for shortness of breath.   Cardiovascular: Negative for chest pain.  Gastrointestinal: Negative for abdominal pain.  Skin: Positive for color change.       Left lateral breast/axilla area reddened and swollen  Psychiatric/Behavioral: Negative for behavioral problems and confusion.    Vital Signs: BP 110/65 (BP Location: Right Arm)   Pulse 92   Temp 98.2 F (36.8 C) (Oral)   Resp 17   Ht 5' 6"  (1.676 m)   Wt 199 lb 11.8 oz (90.6 kg)   SpO2 100%   BMI 32.24 kg/m   Physical Exam  Constitutional: She is oriented to person, place, and time. She appears well-nourished.  Cardiovascular: Normal rate and regular rhythm.   Pulmonary/Chest: Effort normal and breath sounds normal.  Abdominal: Soft. Bowel sounds are normal.  Musculoskeletal: Normal range of motion.  Neurological: She is alert and oriented to person, place, and time.  Skin: Skin is warm and dry. There is erythema.  reddened skin at Left lateral breast/axilla  Psychiatric: She has a normal mood and affect. Her behavior is normal. Judgment and thought content normal.  Nursing note and vitals reviewed.   Mallampati Score:  MD Evaluation Airway: WNL Heart: WNL Abdomen: WNL Chest/ Lungs: WNL ASA  Classification: 2 Mallampati/Airway Score: One  Imaging: Mm Breast Surgical Specimen  Result Date: 04/15/2016 CLINICAL DATA:  Status post surgical excision of left breast DCIS following seed localization. EXAM: SPECIMEN RADIOGRAPH OF THE LEFT BREAST COMPARISON:  Previous exam(s). FINDINGS: Status post excision of the left breast. The radioactive seed and biopsy marker clip are present, completely intact, and were marked for pathology. IMPRESSION: Specimen radiograph of the left breast. Electronically Signed   By: Lajean Manes M.D.   On:  04/15/2016 10:39   US Breast Ltd Uni Left Inc Axilla  Result Date: 04/12/2016 CLINICAL DATA:  Palpable left axillary nodule. Patient has a biopsy-proven high grade DCIS within a complex sclerosing lesion in the left breast. EXAM: ULTRASOUND OF THE LEFT BREAST COMPARISON:  Previous exam(s). FINDINGS: On physical exam, no suspicious masses are detected. Targeted ultrasound is performed, showing no suspicious masses or lymph nodes in the left axilla. Benign-appearing lymph nodes are noted. IMPRESSION: No sonographic evidence of left axillary lymphadenopathy. RECOMMENDATION: Continue with plan of care for known left breast DCIS. I have discussed the findings and recommendations with the patient. Results were also provided in writing at the conclusion of the visit. If applicable,  a reminder letter will be sent to the patient regarding the next appointment. BI-RADS CATEGORY  6: Known biopsy-proven malignancy. Electronically Signed   By: Fidela Salisbury M.D.   On: 04/12/2016 17:00   Mm Lt Radioactive Seed Loc Mammo Guide  Result Date: 04/13/2016 CLINICAL DATA:  Patient with left breast DCIS and complex sclerosing lesion scheduled for lumpectomy requiring preoperative radioactive seed localization. EXAM: MAMMOGRAPHIC GUIDED RADIOACTIVE SEED LOCALIZATION OF THE LEFT BREAST COMPARISON:  Previous exam(s). FINDINGS: Patient presents for radioactive seed localization prior to breast conservation surgery. I met with the patient and we discussed the procedure of seed localization including benefits and alternatives. We discussed the high likelihood of a successful procedure. We discussed the risks of the procedure including infection, bleeding, tissue injury and further surgery. We discussed the low dose of radioactivity involved in the procedure. Informed, written consent was given. The usual time-out protocol was performed immediately prior to the procedure. Using mammographic guidance, sterile technique, 1% lidocaine  and an I-125 radioactive seed, the X shaped clip within the inner left breast was localized using a medial approach. The follow-up mammogram images confirm the seed in the expected location and were marked for Dr. Marlou Starks. Follow-up survey of the patient confirms presence of the radioactive seed. Order number of I-125 seed:  [id number]. Total activity:  3.833 millicuries  Reference Date: 03/18/2016 The patient tolerated the procedure well and was released from the [facility]. She was given instructions regarding seed removal. IMPRESSION: Radioactive seed localization left breast. No apparent complications. Electronically Signed   By: Franki Cabot M.D.   On: 04/13/2016 15:13    Labs:  CBC:  Recent Labs  03/23/16 1240  WBC 5.1  HGB 13.9  HCT 41.5  PLT 236    COAGS: No results for input(s): INR, APTT in the last 8760 hours.  BMP:  Recent Labs  12/11/15 1126 03/23/16 1240  NA 140 141  K 3.7 4.7  CL 106  --   CO2 28 25  GLUCOSE 88 97  BUN 11 13.0  CALCIUM 9.2 9.6  CREATININE 0.61 0.7    LIVER FUNCTION TESTS:  Recent Labs  03/23/16 1240  BILITOT 0.42  AST 21  ALT 20  ALKPHOS 79  PROT 7.8  ALBUMIN 4.0    TUMOR MARKERS: No results for input(s): AFPTM, CEA, CA199, CHROMGRNA in the last 8760 hours.  Assessment and Plan:  Left breast lumpectomy 04/15/16 Ductal carcinoma in situ Cellulitis; seroma Aspiration in office 1/22--cxs pending Worsening cellulitis For imaging and possible seroma aspiration using US guidance Risks and Benefits discussed with the patient including, but not limited to bleeding, infection, damage to adjacent structures or low yield requiring additional tests. All of the patient's questions were answered, patient is agreeable to proceed. Consent signed and in chart.   Thank you for this interesting consult.  I greatly enjoyed meeting Wynn Kernes and look forward to participating in their care.  A copy of this report was sent to the requesting  provider on this date.  Electronically Signed: Monia Sabal A 05/04/2016, 1:53 PM   I spent a total of 40 Minutes    in face to face in clinical consultation, greater than 50% of which was counseling/coordinating care for left breast seroma aspiration

## 2016-05-04 NOTE — Progress Notes (Signed)
MD paged for second time as no admission orders seen in chart. Checked with supervisor as advised by MD and still no orders found

## 2016-05-04 NOTE — Procedures (Signed)
US aspiration L axillary collection  42ml 0000000 No complication No blood loss. See complete dictation in [facility].

## 2016-05-04 NOTE — Progress Notes (Signed)
Pt roomed as a direct admit of Dr Marlou Starks. No admission orders to release in chart, MD paged

## 2016-05-04 NOTE — Progress Notes (Signed)
Pharmacy Antibiotic Note  Bethany Gardner is a 56 y.o. female admitted on 05/04/2016 with redness on left breast.  Patient has a recent history of left breast lumpectomy for ductal carcinoma.  Pharmacy has been consulted for vancomycin and Zosyn dosing for cellulitis that did not respond on oral Keflex nor doxycycline.  Baseline labs pending collection.   Plan: Based on lab data now available: Give first dose ordered then, Vancomycin 1250mg  IV every 12 hours. Goal trough 10-15 mcg/mL. Give first dose ordered then, Zosyn 3.375g IV every 8 hours - each dose over 4 hours.  Follow-up renal function, clinical status, and culture results.    Height: 5\' 6"  (167.6 cm) Weight: 199 lb 11.8 oz (90.6 kg) IBW/kg (Calculated) : 59.3  Temp (24hrs), Avg:98.4 F (36.9 C), Min:98.2 F (36.8 C), Max:98.6 F (37 C)   Recent Labs Lab 05/04/16 1354  WBC 5.5  CREATININE 0.67    Estimated Creatinine Clearance: 91.1 mL/min (by C-G formula based on SCr of 0.67 mg/dL).    No Known Allergies  Antimicrobials this admission: Vanc 1/24 >> Zosyn 1/24 >>  Dose adjustments this admission: N/A  Microbiology results: N/A   Sloan Leiter, PharmD, BCPS Clinical Pharmacist Clinical phone [phone] until 3:30 PM After hours, call [phone] 05/04/2016, 3:02 PM

## 2016-05-05 MED ORDER — BISACODYL 10 MG RE SUPP
10.0000 mg | Freq: Every day | RECTAL | Status: DC | PRN
  Administered 2016-05-05: 10 mg via RECTAL
  Filled 2016-05-05: qty 1

## 2016-05-05 MED ORDER — DOCUSATE SODIUM 100 MG PO CAPS
100.0000 mg | ORAL_CAPSULE | Freq: Two times a day (BID) | ORAL | Status: DC
  Administered 2016-05-05 – 2016-05-06 (×3): 100 mg via ORAL
  Filled 2016-05-05 (×3): qty 1

## 2016-05-05 MED ORDER — SACCHAROMYCES BOULARDII 250 MG PO CAPS
250.0000 mg | ORAL_CAPSULE | Freq: Two times a day (BID) | ORAL | Status: DC
  Administered 2016-05-05 – 2016-05-06 (×3): 250 mg via ORAL
  Filled 2016-05-05 (×3): qty 1

## 2016-05-05 MED ORDER — PANTOPRAZOLE SODIUM 40 MG PO TBEC
40.0000 mg | DELAYED_RELEASE_TABLET | Freq: Every day | ORAL | Status: DC
  Administered 2016-05-05: 40 mg via ORAL
  Filled 2016-05-05: qty 1

## 2016-05-05 NOTE — Progress Notes (Signed)
  Subjective: Complains of soreness left axilla  Objective: Vital signs in last 24 hours: Temp:  [98 F (36.7 C)-98.8 F (37.1 C)] 98.6 F (37 C) (01/25 0950) Pulse Rate:  [75-87] 78 (01/25 0950) Resp:  [16-19] 19 (01/25 0950) BP: (107-126)/(62-71) 110/68 (01/25 0950) SpO2:  [97 %-100 %] 99 % (01/25 0950) Last BM Date: 05/04/16  Intake/Output from previous day: 01/24 0701 - 01/25 0700 In: 1612.5 [P.O.:600; I.V.:712.5; IV Piggyback:300] Out: -  Intake/Output this shift: Total I/O In: 240 [P.O.:240] Out: -   Resp: clear to auscultation bilaterally Breasts: left axilla less red and swollen Cardio: regular rate and rhythm GI: soft, non-tender; bowel sounds normal; no masses,  no organomegaly  Lab Results:   Recent Labs  05/04/16 1354  WBC 5.5  HGB 12.1  HCT 35.1*  PLT 267   BMET  Recent Labs  05/04/16 1354  NA 141  K 3.9  CL 107  CO2 27  GLUCOSE 131*  BUN 9  CREATININE 0.67  CALCIUM 9.2   PT/INR  Recent Labs  05/04/16 1354  LABPROT 13.4  INR 1.02   ABG No results for input(s): PHART, HCO3 in the last 72 hours.  Invalid input(s): PCO2, PO2  Studies/Results: US Aspiration  Result Date: 05/04/2016 CLINICAL DATA:  Breast biopsy with sentinel lymph node biopsy, left. Postop seroma which was aspirated. Now with worsening overlying redness. Repeat aspiration is requested. EXAM: US ASPIRATION COMPARISON:  None. TECHNIQUE: Survey ultrasound of the left axilla was performed and a 6.4 cm complex fluid collection was identified. Overlying skin prepped with chlorhexidine, draped in usual sterile fashion, infiltrated locally with 1% lidocaine. An 18-gauge needle was advanced into the collection under ultrasound guidance. Approximately 55 mL of thin cloudy serosanguineous fluid were aspirated. Patient tolerated procedure well. COMPLICATIONS: None immediate IMPRESSION: 1. Technically successful ultrasound-guided aspiration of left axillary collection returning 55 mL  serosanguineous fluid . Electronically Signed   By: Lucrezia Europe M.D.   On: 05/04/2016 15:56    Anti-infectives: Anti-infectives    Start     Dose/Rate Route Frequency Ordered Stop   05/05/16 0300  vancomycin (VANCOCIN) 1,250 mg in sodium chloride 0.9 % 250 mL IVPB     1,250 mg 166.7 mL/hr over 90 Minutes Intravenous Every 12 hours 05/04/16 1502     05/04/16 2300  piperacillin-tazobactam (ZOSYN) IVPB 3.375 g     3.375 g 12.5 mL/hr over 240 Minutes Intravenous Every 8 hours 05/04/16 1502     05/04/16 1300  vancomycin (VANCOCIN) 1,500 mg in sodium chloride 0.9 % 500 mL IVPB     1,500 mg 250 mL/hr over 120 Minutes Intravenous  Once 05/04/16 1252 05/04/16 1746   05/04/16 1300  piperacillin-tazobactam (ZOSYN) IVPB 3.375 g     3.375 g 100 mL/hr over 30 Minutes Intravenous  Once 05/04/16 1252 05/04/16 1615      Assessment/Plan: s/p * No surgery found * Advance diet  Continue IV vanc and zosyn ambulate  LOS: 1 day    TOTH III,Aniah Pauli S 05/05/2016

## 2016-05-06 MED ORDER — DOXYCYCLINE HYCLATE 100 MG PO TABS: 100.0000 mg | ORAL_TABLET | Freq: Two times a day (BID) | ORAL | 2 refills | Status: DC

## 2016-05-06 NOTE — Progress Notes (Addendum)
D/C papers gone over with pt. Prescription given to pt. No questions/complaints. Pt. Waiting for husband to come and pick her up. IV taken out.

## 2016-05-06 NOTE — Progress Notes (Signed)
Pt. D/c'd successfully via w/c. 

## 2016-05-06 NOTE — Progress Notes (Signed)
  Subjective: Feeling better.  Objective: Vital signs in last 24 hours: Temp:  [97.9 F (36.6 C)-98.6 F (37 C)] 97.9 F (36.6 C) (01/25 2022) Pulse Rate:  [78-82] 81 (01/25 2022) Resp:  [17-19] 17 (01/25 2022) BP: (110-118)/(62-68) 112/68 (01/25 2022) SpO2:  [99 %-100 %] 100 % (01/25 2022) Last BM Date: 05/05/16  Intake/Output from previous day: 01/25 0701 - 01/26 0700 In: 2245 [P.O.:720; I.V.:925; IV Piggyback:600] Out: -  Intake/Output this shift: No intake/output data recorded.  Resp: clear to auscultation bilaterally Breasts: left axillary redness fading slowly. induration unchanged Cardio: regular rate and rhythm GI: soft, non-tender; bowel sounds normal; no masses,  no organomegaly  Lab Results:   Recent Labs  05/04/16 1354  WBC 5.5  HGB 12.1  HCT 35.1*  PLT 267   BMET  Recent Labs  05/04/16 1354  NA 141  K 3.9  CL 107  CO2 27  GLUCOSE 131*  BUN 9  CREATININE 0.67  CALCIUM 9.2   PT/INR  Recent Labs  05/04/16 1354  LABPROT 13.4  INR 1.02   ABG No results for input(s): PHART, HCO3 in the last 72 hours.  Invalid input(s): PCO2, PO2  Studies/Results: US Aspiration  Result Date: 05/04/2016 CLINICAL DATA:  Breast biopsy with sentinel lymph node biopsy, left. Postop seroma which was aspirated. Now with worsening overlying redness. Repeat aspiration is requested. EXAM: US ASPIRATION COMPARISON:  None. TECHNIQUE: Survey ultrasound of the left axilla was performed and a 6.4 cm complex fluid collection was identified. Overlying skin prepped with chlorhexidine, draped in usual sterile fashion, infiltrated locally with 1% lidocaine. An 18-gauge needle was advanced into the collection under ultrasound guidance. Approximately 55 mL of thin cloudy serosanguineous fluid were aspirated. Patient tolerated procedure well. COMPLICATIONS: None immediate IMPRESSION: 1. Technically successful ultrasound-guided aspiration of left axillary collection returning 55 mL  serosanguineous fluid . Electronically Signed   By: Lucrezia Europe M.D.   On: 05/04/2016 15:56    Anti-infectives: Anti-infectives    Start     Dose/Rate Route Frequency Ordered Stop   05/05/16 0300  vancomycin (VANCOCIN) 1,250 mg in sodium chloride 0.9 % 250 mL IVPB     1,250 mg 166.7 mL/hr over 90 Minutes Intravenous Every 12 hours 05/04/16 1502     05/04/16 2300  piperacillin-tazobactam (ZOSYN) IVPB 3.375 g     3.375 g 12.5 mL/hr over 240 Minutes Intravenous Every 8 hours 05/04/16 1502     05/04/16 1300  vancomycin (VANCOCIN) 1,500 mg in sodium chloride 0.9 % 500 mL IVPB     1,500 mg 250 mL/hr over 120 Minutes Intravenous  Once 05/04/16 1252 05/04/16 1746   05/04/16 1300  piperacillin-tazobactam (ZOSYN) IVPB 3.375 g     3.375 g 100 mL/hr over 30 Minutes Intravenous  Once 05/04/16 1252 05/04/16 1615      Assessment/Plan: s/p * No surgery found * Advance diet Discharge  LOS: 2 days    TOTH III,PAUL S 05/06/2016

## 2016-05-09 LAB — AEROBIC/ANAEROBIC CULTURE W GRAM STAIN (SURGICAL/DEEP WOUND): Culture: NO GROWTH

## 2016-05-09 LAB — AEROBIC/ANAEROBIC CULTURE (SURGICAL/DEEP WOUND)

## 2016-05-11 ENCOUNTER — Other Ambulatory Visit: Payer: Self-pay | Admitting: *Deleted

## 2016-05-11 NOTE — Patient Outreach (Signed)
[facility]) Care Management  05/11/2016  Bethany Gardner 05-05-1960 [id number]   Per Bethany Gardner at [facility], patient is currently being followed by internal [employer] CM, and [facility] does not need to contact patient.   RNCM will proceed with case closure due to other CM program and send case closure request to [facility] Management.    Bethany Gardner H. Bethany Gardner, BSN, [facility] Telephonic CM Phone: [phone] Fax: [phone]

## 2016-05-11 NOTE — Discharge Summary (Signed)
Physician Discharge Summary  Patient ID: Bethany Gardner MRN: [id number] DOB/AGE: 1960-05-17 56 y.o.  Admit date: 05/04/2016 Discharge date: 05/11/2016  Admission Diagnoses:  Discharge Diagnoses:  Active Problems:   Ductal carcinoma in situ (DCIS) of left breast   Discharged Condition: good  Hospital Course: the patient recently underwent left breast lumpectomy and sentinel node mapping. Her postop course was complicated by development of a small left axillary cellulitis and seroma. Since the cellulitis worsened she was admitted for aspiration and a couple days of IV antibiotics. She seemed to improve and was then discharged home with close follow up  Consults: None  Significant Diagnostic Studies: none  Treatments: as above  Discharge Exam: Blood pressure 112/68, pulse 81, temperature 97.9 F (36.6 C), temperature source Oral, resp. rate 17, height 5\' 6"  (1.676 m), weight 90.6 kg (199 lb 11.8 oz), SpO2 100 %. Resp: clear to auscultation bilaterally Breasts: left axillary incision less swollen and red Cardio: regular rate and rhythm GI: soft, non-tender; bowel sounds normal; no masses,  no organomegaly  Disposition: 01-Home or Self Care  Discharge Instructions    Call MD for:  difficulty breathing, headache or visual disturbances    Complete by:  As directed    Call MD for:  extreme fatigue    Complete by:  As directed    Call MD for:  hives    Complete by:  As directed    Call MD for:  persistant dizziness or light-headedness    Complete by:  As directed    Call MD for:  persistant nausea and vomiting    Complete by:  As directed    Call MD for:  redness, tenderness, or signs of infection (pain, swelling, redness, odor or green/yellow discharge around incision site)    Complete by:  As directed    Call MD for:  severe uncontrolled pain    Complete by:  As directed    Call MD for:  temperature >100.4    Complete by:  As directed    Diet - low sodium heart healthy     Complete by:  As directed    Discharge instructions    Complete by:  As directed    Avoid heavy lifting. May shower. Diet as tolerated   Increase activity slowly    Complete by:  As directed    No wound care    Complete by:  As directed      Allergies as of 05/06/2016   No Known Allergies     Medication List    TAKE these medications   doxycycline 100 MG tablet Commonly known as:  VIBRA-TABS Take 100 mg by mouth 2 (two) times daily. What changed:  Another medication with the same name was added. Make sure you understand how and when to take each.   doxycycline 100 MG tablet Commonly known as:  VIBRA-TABS Take 1 tablet (100 mg total) by mouth 2 (two) times daily. What changed:  You were already taking a medication with the same name, and this prescription was added. Make sure you understand how and when to take each.   HYDROcodone-acetaminophen 5-325 MG tablet Commonly known as:  NORCO/VICODIN Take 1-2 tablets by mouth every 4 (four) hours as needed for moderate pain or severe pain.   ibuprofen 200 MG tablet Commonly known as:  ADVIL,MOTRIN Take 200-400 mg by mouth every 6 (six) hours as needed for pain.   tamoxifen 20 MG tablet Commonly known as:  NOLVADEX Take 1 tablet (20 mg  total) by mouth daily. What changed:  when to take this   traMADol 50 MG tablet Commonly known as:  ULTRAM Take 50 mg by mouth daily as needed for moderate pain.      Follow-up Information    TOTH III,Chet Greenley S, MD Follow up in 1 week(s).   Specialty:  General Surgery Contact information: [address] [place] [address] [phone]           Signed: Merrie Roof 05/11/2016, 9:53 AM

## 2016-05-12 ENCOUNTER — Ambulatory Visit
Admission: RE | Admit: 2016-05-12 | Discharge: 2016-05-12 | Disposition: A | Discharge status: Home / Self Care | Payer: Managed Care, Other (non HMO) | Source: Ambulatory Visit | Attending: Radiation Oncology | Admitting: Radiation Oncology

## 2016-05-12 ENCOUNTER — Encounter: Payer: Self-pay | Admitting: Radiation Oncology

## 2016-05-12 DIAGNOSIS — E669 Obesity, unspecified: Secondary | ICD-10-CM | POA: Diagnosis not present

## 2016-05-12 DIAGNOSIS — Z808 Family history of malignant neoplasm of other organs or systems: Secondary | ICD-10-CM | POA: Diagnosis not present

## 2016-05-12 DIAGNOSIS — Z823 Family history of stroke: Secondary | ICD-10-CM | POA: Diagnosis not present

## 2016-05-12 DIAGNOSIS — C50312 Malignant neoplasm of lower-inner quadrant of left female breast: Secondary | ICD-10-CM

## 2016-05-12 DIAGNOSIS — D0512 Intraductal carcinoma in situ of left breast: Secondary | ICD-10-CM

## 2016-05-12 DIAGNOSIS — Z510 Encounter for antineoplastic radiation therapy: Secondary | ICD-10-CM | POA: Diagnosis not present

## 2016-05-12 DIAGNOSIS — Z6830 Body mass index (BMI) 30.0-30.9, adult: Secondary | ICD-10-CM | POA: Diagnosis not present

## 2016-05-12 DIAGNOSIS — Z170 Estrogen receptor positive status [ER+]: Principal | ICD-10-CM

## 2016-05-12 DIAGNOSIS — Z8249 Family history of ischemic heart disease and other diseases of the circulatory system: Secondary | ICD-10-CM | POA: Diagnosis not present

## 2016-05-12 DIAGNOSIS — Z803 Family history of malignant neoplasm of breast: Secondary | ICD-10-CM | POA: Diagnosis not present

## 2016-05-12 NOTE — Progress Notes (Signed)
  Radiation Oncology         [phone]) [phone] ________________________________  Name: Bethany Gardner MRN: [id number]  Date: 05/12/2016  DOB: Dec 20, 1960  Optical Surface Tracking Plan:  Since intensity modulated radiotherapy (IMRT) and 3D conformal radiation treatment methods are predicated on accurate and precise positioning for treatment, intrafraction motion monitoring is medically necessary to ensure accurate and safe treatment delivery.  The ability to quantify intrafraction motion without excessive ionizing radiation dose can only be performed with optical surface tracking. Accordingly, surface imaging offers the opportunity to obtain 3D measurements of patient position throughout IMRT and 3D treatments without excessive radiation exposure.  I am ordering optical surface tracking for this patient's upcoming course of radiotherapy. ________________________________  Kyung Rudd, MD 05/12/2016 8:09 PM    Reference:   Particia Jasper, et al. Surface imaging-based analysis of intrafraction motion for breast radiotherapy patients.Journal of [employer], n. 6, nov. 2014. ISSN [id number].   Available at: <http://www.jacmp.org/index.php/jacmp/article/view/4957>.

## 2016-05-12 NOTE — Progress Notes (Signed)
Radiation Oncology         [phone]) [phone] ________________________________  Name: Bethany Gardner MRN: [id number]  Date: 05/12/2016  DOB: 1960-11-06  CC:KIM, Nickola Major., DO  Magrinat, Virgie Dad, MD     REFERRING PHYSICIAN: Magrinat, Virgie Dad, MD   DIAGNOSIS: The encounter diagnosis was Malignant neoplasm of lower-inner quadrant of left breast in female, estrogen receptor positive ([place]).  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 female initially seen in consultation during the multidisciplinary breast clinic on 03/23/16. She originally presented with suspicious calcifications on screening mammogram on 03/08/16. A diagnostic mammogram of the left breast on 03/14/16 showed a small group of calcifications measuring 1.5 x 0.8 x 2.1 cm in the lower inner quadrant. Ultrasound at the time showed normal appearing fibroglandular tissue in the medial aspect of the left breast. Ultrasound of the left axilla was negative for adenopathy. Biopsy on 03/14/16 in the 7:00 position. This returned positive for high grade DCIS with calcifications and a complex sclerosing lesion. There were foci highly suspicious for stromal invasion.  Since initial consultation, the patient underwent left lumpectomy with radioactive seed and sentinel lymph node biopsy on 04/15/16 with Dr. Marlou Starks. The malignancy was found to have additional anterior, lateral, and deep margins, as well as 4 lymph nodes negative for malignancy. The patient's margins were less than 1 mm from the lateral margin, and 2-3 from the anterior margin. She comes to discuss the role of radiotherapy. Her course has been delayed by the development of cellulitis requiring antibiotics, ultimately IV antibiotics as an inpatient. She's in the process of completing a course of Doxycycline. She follows up with Dr. Marlou Starks next week.   PREVIOUS RADIATION THERAPY: No   PAST MEDICAL HISTORY:  Past Medical  History:  Diagnosis Date  . Anemia    father has Factor V Leiden-no issues with it. Pt has not been tested  . Endometriosis   . Fibroid   . Foot pain    bilateral - occ foot pain  . Infertility, female   . Malignant neoplasm of lower-inner quadrant of left female breast ([place]) 03/16/2016   left breast DCIS  . Obesity        PAST SURGICAL HISTORY: Past Surgical History:  Procedure Laterality Date  . ABDOMINAL HYSTERECTOMY    . BREAST BIOPSY Left    benign  . BREAST LUMPECTOMY WITH RADIOACTIVE SEED AND SENTINEL LYMPH NODE BIOPSY Left 04/15/2016   Procedure: BREAST LUMPECTOMY WITH RADIOACTIVE SEED AND SENTINEL LYMPH NODE BIOPSY;  Surgeon: Autumn Messing III, MD;  Location: [facility];  Service: General;  Laterality: Left;  BREAST LUMPECTOMY WITH RADIOACTIVE SEED AND SENTINEL LYMPH NODE BIOPSY  . DILATION AND CURETTAGE OF UTERUS    . laproscopy     three  . TONSILLECTOMY       FAMILY HISTORY:  Family History  Problem Relation Age of Onset  . Heart disease Father   . Heart attack Father 52  . Transient ischemic attack Mother   . Stroke Mother   . Breast cancer Maternal Grandmother   . Melanoma Sister   . Colon cancer Neg Hx   . Colon polyps Neg Hx   . Esophageal cancer Neg Hx   . Rectal cancer Neg Hx   . Stomach cancer Neg Hx      SOCIAL HISTORY:  reports that she has never smoked. She has never used smokeless tobacco.  She reports that she drinks about 4.2 oz of alcohol per week . She reports that she does not use drugs. She lives in [place] and recently relocated from [place].    ALLERGIES: Patient has no known allergies.   MEDICATIONS:  Current Outpatient Prescriptions  Medication Sig Dispense Refill  . doxycycline (VIBRA-TABS) 100 MG tablet Take 100 mg by mouth 2 (two) times daily.    Marland Kitchen doxycycline (VIBRA-TABS) 100 MG tablet Take 1 tablet (100 mg total) by mouth 2 (two) times daily. 30 tablet 2  . ibuprofen (ADVIL,MOTRIN) 200 MG tablet Take 200-400  mg by mouth every 6 (six) hours as needed for pain.     . tamoxifen (NOLVADEX) 20 MG tablet Take 1 tablet (20 mg total) by mouth daily. (Patient taking differently: Take 20 mg by mouth at bedtime. ) 30 tablet 2  . HYDROcodone-acetaminophen (NORCO/VICODIN) 5-325 MG tablet Take 1-2 tablets by mouth every 4 (four) hours as needed for moderate pain or severe pain. (Patient not taking: Reported on 05/04/2016) 20 tablet 0  . traMADol (ULTRAM) 50 MG tablet Take 50 mg by mouth daily as needed for moderate pain.      No current facility-administered medications for this encounter.    Facility-Administered Medications Ordered in Other Encounters  Medication Dose Route Frequency Provider Last Rate Last Dose  . dextrose 5 % and 0.9 % NaCl with KCl 20 mEq/L infusion   Intravenous Continuous Autumn Messing III, MD      . heparin injection 5,000 Units  5,000 Units Subcutaneous Q8H Autumn Messing III, MD      . HYDROcodone-acetaminophen (NORCO/VICODIN) 5-325 MG per tablet 1-2 tablet  1-2 tablet Oral Q4H PRN Autumn Messing III, MD      . ondansetron (ZOFRAN-ODT) disintegrating tablet 4 mg  4 mg Oral Q6H PRN Autumn Messing III, MD       Or  . ondansetron (ZOFRAN) 4 mg in sodium chloride 0.9 % 50 mL IVPB  4 mg Intravenous Q6H PRN Autumn Messing III, MD      . pantoprazole (PROTONIX) injection 40 mg  40 mg Intravenous QHS Autumn Messing III, MD         REVIEW OF SYSTEMS: On review of systems, the patient reports that she is doing well overall particularly since she's been treated for a surgical site infection. She reports some pain under the left axilla, though this has improved in the last week.. She denies any chest pain, shortness of breath, cough, fevers, chills, night sweats, unintended weight changes. She denies any bowel or bladder disturbances, and denies abdominal pain, nausea or vomiting. She denies any new musculoskeletal or joint aches or pains. A complete review of systems is obtained and is otherwise negative.     PHYSICAL EXAM:    Wt Readings from Last 3 Encounters:  05/12/16 188 lb 3.2 oz (85.4 kg)  05/04/16 199 lb 11.8 oz (90.6 kg)  04/15/16 199 lb (90.3 kg)   Temp Readings from Last 3 Encounters:  05/12/16 98.2 F (36.8 C) (Oral)  05/05/16 97.9 F (36.6 C) (Oral)  04/15/16 97.9 F (36.6 C)   BP Readings from Last 3 Encounters:  05/12/16 114/67  05/05/16 112/68  04/15/16 137/68   Pulse Readings from Last 3 Encounters:  05/12/16 79  05/05/16 81  04/15/16 92   In general this is a well appearing Caucasian female in no acute distress. She's alert and oriented x4 and appropriate throughout the examination. Cardiopulmonary assessment is negative for acute distress and she exhibits  normal effort. Examination of the left breast reveals well healed axillary and lumpectomy incisions. Minimal flushing is noted at the axillary site without cellulitis. Mild edema is noted as well. No other evidence of streaking, cellulitic change, or separation is otherwise noted.   ECOG = 1  0 - Asymptomatic (Fully active, able to carry on all predisease activities without restriction)  1 - Symptomatic but completely ambulatory (Restricted in physically strenuous activity but ambulatory and able to carry out work of a light or sedentary nature. For example, light housework, office work)  2 - Symptomatic, <50% in bed during the day (Ambulatory and capable of all self care but unable to carry out any work activities. Up and about more than 50% of waking hours)  3 - Symptomatic, >50% in bed, but not bedbound (Capable of only limited self-care, confined to bed or chair 50% or more of waking hours)  4 - Bedbound (Completely disabled. Cannot carry on any self-care. Totally confined to bed or chair)  5 - Death   Eustace Pen MM, Creech RH, Tormey DC, et al. [phone]). "Toxicity and response criteria of the [facility]". [place] Oncol. 5 (6): 649-55    LABORATORY DATA:  Lab Results  Component Value Date   WBC  5.5 05/04/2016   HGB 12.1 05/04/2016   HCT 35.1 (L) 05/04/2016   MCV 91.9 05/04/2016   PLT 267 05/04/2016   Lab Results  Component Value Date   NA 141 05/04/2016   K 3.9 05/04/2016   CL 107 05/04/2016   CO2 27 05/04/2016   Lab Results  Component Value Date   ALT 20 03/23/2016   AST 21 03/23/2016   ALKPHOS 79 03/23/2016   BILITOT 0.42 03/23/2016      RADIOGRAPHY: US Aspiration  Result Date: 05/04/2016 CLINICAL DATA:  Breast biopsy with sentinel lymph node biopsy, left. Postop seroma which was aspirated. Now with worsening overlying redness. Repeat aspiration is requested. EXAM: US ASPIRATION COMPARISON:  None. TECHNIQUE: Survey ultrasound of the left axilla was performed and a 6.4 cm complex fluid collection was identified. Overlying skin prepped with chlorhexidine, draped in usual sterile fashion, infiltrated locally with 1% lidocaine. An 18-gauge needle was advanced into the collection under ultrasound guidance. Approximately 55 mL of thin cloudy serosanguineous fluid were aspirated. Patient tolerated procedure well. COMPLICATIONS: None immediate IMPRESSION: 1. Technically successful ultrasound-guided aspiration of left axillary collection returning 55 mL serosanguineous fluid . Electronically Signed   By: Lucrezia Europe M.D.   On: 05/04/2016 15:56       IMPRESSION/PLAN: 1. ER/PR positive high grade DCIS of the left breast. Dr. Lisbeth Renshaw discusses the pathology findings and reviews the nature of in situ breast disease. The patient appears to be well healed and over the infection she's been treated for. We discussed moving forward with external radiotherapy to the left breast followed by antiestrogen therapy. We discussed the risks, benefits, short, and long term effects of radiotherapy, and the patient is interested in proceeding. Dr. Lisbeth Renshaw discusses the delivery and logistics of radiotherapy over 6 1/2 weeks. We will plan simulation today with breath hold technique to avoid exposure to the  heart, and anticipate starting treatment within the next two weeks. Written consent was obtained and placed in her chart. She will proceed to simulation today.  In a visit lasting 25 minutes, greater than 50% of the time was spent face to face discussing logistics of treating left sided disease, and coordinating the patient's care.   The above  documentation reflects my direct findings during this shared patient visit. Please see the separate note by Dr. Lisbeth Renshaw on this date for the remainder of the patient's plan of care.    Carola Rhine, PAC

## 2016-05-12 NOTE — Progress Notes (Signed)
Please see the Nurse Progress Note in the MD Initial Consult Encounter for this patient. 

## 2016-05-12 NOTE — Progress Notes (Signed)
  Radiation Oncology         [phone]) [phone] ________________________________  Name: Bethany Gardner MRN: [id number]  Date: 05/12/2016  DOB: [birth year]  DIAGNOSIS:     ICD-9-CM ICD-10-CM   1. Malignant neoplasm of lower-inner quadrant of left breast in female, estrogen receptor positive ([place]) 174.3 C50.312    V86.0 Z17.0   2. Ductal carcinoma in situ (DCIS) of left breast 233.0 D05.12      SIMULATION AND TREATMENT PLANNING NOTE  The patient presented for simulation prior to beginning her course of radiation treatment for her diagnosis of left-sided breast cancer. The patient was placed in a supine position on a breast board. A customized vac-lock bag was constructed and this complex treatment device will be used on a daily basis during her treatment. In this fashion, a CT scan was obtained through the chest area and an isocenter was placed near the chest wall within the breast.  The patient will be planned to receive a course of radiation initially to a dose of 50.4 Gy. This will consist of a whole breast radiotherapy technique. To accomplish this, 2 customized blocks have been designed which will correspond to medial and lateral whole breast tangent fields. This treatment will be accomplished at 1.8 Gy per fraction. A forward planning technique will also be evaluated to determine if this approach improves the plan. It is anticipated that the patient will then receive a 14 Gy boost to the seroma cavity which has been contoured. This will be accomplished at 2 Gy per fraction.   This initial treatment will consist of a 3-D conformal technique. The seroma has been contoured as the primary target structure. Additionally, dose volume histograms of both this target as well as the lungs and heart will also be evaluated. Such an approach is necessary to ensure that the target area is adequately covered while the nearby critical  normal structures are adequately spared.  Plan:  The final anticipated total  dose therefore will correspond to 64.4 Gy.  Special treatment procedure was performed today due to the extra time and effort required by myself to plan and prepare this patient for deep inspiration breath hold technique.  I have determined cardiac sparing to be of benefit to this patient to prevent long term cardiac damage due to radiation of the heart.  Bellows were placed on the patient's abdomen. To facilitate cardiac sparing, the patient was coached by the radiation therapists on breath hold techniques and breathing practice was performed. Practice waveforms were obtained. The patient was then scanned while maintaining breath hold in the treatment position.  This image was then transferred over to the imaging specialist. The imaging specialist then created a fusion of the free breathing and breath hold scans using the chest wall as the stable structure. I personally reviewed the fusion in axial, coronal and sagittal image planes.  Excellent cardiac sparing was obtained.  I felt the patient is an appropriate candidate for breath hold and the patient will be treated as such.  The image fusion was then reviewed with the patient to reinforce the necessity of reproducible breath hold.      _______________________________   Jodelle Gross, MD, PhD

## 2016-05-19 ENCOUNTER — Ambulatory Visit
Admission: RE | Admit: 2016-05-19 | Discharge: 2016-05-19 | Disposition: A | Discharge status: Home / Self Care | Payer: Managed Care, Other (non HMO) | Source: Ambulatory Visit | Attending: Radiation Oncology | Admitting: Radiation Oncology

## 2016-05-19 DIAGNOSIS — Z510 Encounter for antineoplastic radiation therapy: Secondary | ICD-10-CM | POA: Diagnosis not present

## 2016-05-20 ENCOUNTER — Telehealth: Payer: Self-pay | Admitting: Medical Oncology

## 2016-05-20 NOTE — Telephone Encounter (Signed)
Does she stop tamoxifen while getting radiation?. Per Dr Jana Hakim ( [facility]) I told Bethany Gardner to stop tamoxifen on Sunday.

## 2016-05-23 ENCOUNTER — Ambulatory Visit
Admission: RE | Admit: 2016-05-23 | Discharge: 2016-05-23 | Disposition: A | Discharge status: Home / Self Care | Payer: Managed Care, Other (non HMO) | Source: Ambulatory Visit | Attending: Radiation Oncology | Admitting: Radiation Oncology

## 2016-05-23 DIAGNOSIS — D0512 Intraductal carcinoma in situ of left breast: Secondary | ICD-10-CM | POA: Insufficient documentation

## 2016-05-23 DIAGNOSIS — Z510 Encounter for antineoplastic radiation therapy: Secondary | ICD-10-CM | POA: Diagnosis not present

## 2016-05-23 MED ORDER — RADIAPLEXRX EX GEL
Freq: Once | CUTANEOUS | Status: AC
  Administered 2016-05-23: 13:00:00 via TOPICAL

## 2016-05-23 MED ORDER — ALRA NON-METALLIC DEODORANT (RAD-ONC)
1.0000 "application " | Freq: Once | TOPICAL | Status: AC
  Administered 2016-05-23: 1 via TOPICAL

## 2016-05-24 ENCOUNTER — Ambulatory Visit
Admission: RE | Admit: 2016-05-24 | Discharge: 2016-05-24 | Disposition: A | Discharge status: Home / Self Care | Payer: Managed Care, Other (non HMO) | Source: Ambulatory Visit | Attending: Radiation Oncology | Admitting: Radiation Oncology

## 2016-05-24 ENCOUNTER — Other Ambulatory Visit: Payer: Self-pay | Admitting: Otolaryngology

## 2016-05-24 DIAGNOSIS — Z510 Encounter for antineoplastic radiation therapy: Secondary | ICD-10-CM | POA: Diagnosis not present

## 2016-05-24 DIAGNOSIS — H93A1 Pulsatile tinnitus, right ear: Secondary | ICD-10-CM

## 2016-05-25 ENCOUNTER — Ambulatory Visit
Admission: RE | Admit: 2016-05-25 | Discharge: 2016-05-25 | Disposition: A | Discharge status: Home / Self Care | Payer: Managed Care, Other (non HMO) | Source: Ambulatory Visit | Attending: Radiation Oncology | Admitting: Radiation Oncology

## 2016-05-25 DIAGNOSIS — Z510 Encounter for antineoplastic radiation therapy: Secondary | ICD-10-CM | POA: Diagnosis not present

## 2016-05-26 ENCOUNTER — Inpatient Hospital Stay
Admission: RE | Admit: 2016-05-26 | Discharge: 2016-05-26 | Disposition: A | Discharge status: Home / Self Care | Payer: Managed Care, Other (non HMO) | Source: Ambulatory Visit | Attending: Otolaryngology | Admitting: Otolaryngology

## 2016-05-26 ENCOUNTER — Ambulatory Visit
Admission: RE | Admit: 2016-05-26 | Discharge: 2016-05-26 | Disposition: A | Discharge status: Home / Self Care | Payer: Managed Care, Other (non HMO) | Source: Ambulatory Visit | Attending: Radiation Oncology | Admitting: Radiation Oncology

## 2016-05-26 DIAGNOSIS — Z510 Encounter for antineoplastic radiation therapy: Secondary | ICD-10-CM | POA: Diagnosis not present

## 2016-05-27 ENCOUNTER — Ambulatory Visit
Admission: RE | Admit: 2016-05-27 | Discharge: 2016-05-27 | Disposition: A | Discharge status: Home / Self Care | Payer: Managed Care, Other (non HMO) | Source: Ambulatory Visit | Attending: Radiation Oncology | Admitting: Radiation Oncology

## 2016-05-27 ENCOUNTER — Ambulatory Visit
Admission: RE | Admit: 2016-05-27 | Discharge: 2016-05-27 | Disposition: A | Discharge status: Home / Self Care | Payer: Managed Care, Other (non HMO) | Source: Ambulatory Visit | Attending: Otolaryngology | Admitting: Otolaryngology

## 2016-05-27 ENCOUNTER — Encounter: Payer: Self-pay | Admitting: Radiation Oncology

## 2016-05-27 VITALS — BP 119/82 | HR 82 | Temp 97.6°F | Resp 20

## 2016-05-27 DIAGNOSIS — Z170 Estrogen receptor positive status [ER+]: Principal | ICD-10-CM

## 2016-05-27 DIAGNOSIS — Z510 Encounter for antineoplastic radiation therapy: Secondary | ICD-10-CM | POA: Diagnosis not present

## 2016-05-27 DIAGNOSIS — H93A1 Pulsatile tinnitus, right ear: Secondary | ICD-10-CM

## 2016-05-27 DIAGNOSIS — C50312 Malignant neoplasm of lower-inner quadrant of left female breast: Secondary | ICD-10-CM

## 2016-05-27 MED ORDER — IOPAMIDOL (ISOVUE-370) INJECTION 76%
80.0000 mL | Freq: Once | INTRAVENOUS | Status: AC | PRN
  Administered 2016-05-27: 80 mL via INTRAVENOUS

## 2016-05-27 NOTE — Progress Notes (Signed)
Weekly rad txs left breast 5/35 completed,  Patient declined weight, and stated no skin changes on breast, using radiaplex bid, c/o twinges under axilla and under left arm, appetite  Good 10:22 AM BP 119/82 (BP Location: Right Arm, Patient Position: Sitting, Cuff Size: Normal)   Pulse 82   Temp 97.6 F (36.4 C) (Oral)   Resp 20   Wt Readings from Last 3 Encounters:  05/12/16 188 lb 3.2 oz (85.4 kg)  05/04/16 199 lb 11.8 oz (90.6 kg)  04/15/16 199 lb (90.3 kg)

## 2016-05-28 NOTE — Progress Notes (Signed)
   Department of Radiation Oncology  Phone:  [phone] Fax:        [phone]  Weekly Treatment Note    Name: Bethany Gardner Date: 05/28/2016 MRN: [id number] DOB: 1960-06-13   Diagnosis:     ICD-9-CM ICD-10-CM   1. Malignant neoplasm of lower-inner quadrant of left breast in female, estrogen receptor positive ([place]) 174.3 C50.312    V86.0 Z17.0      Current dose: 9 Gy  Current fraction: 5   MEDICATIONS: Current Outpatient Prescriptions  Medication Sig Dispense Refill  . hyaluronate sodium (RADIAPLEXRX) GEL Apply 1 application topically 2 (two) times daily.    Marland Kitchen ibuprofen (ADVIL,MOTRIN) 200 MG tablet Take 200-400 mg by mouth every 6 (six) hours as needed for pain.     . meclizine (ANTIVERT) 25 MG tablet Take 25 mg by mouth as needed for dizziness.    . non-metallic deodorant Jethro Poling) MISC Apply 1 application topically daily as needed.    . tamoxifen (NOLVADEX) 20 MG tablet Take 1 tablet (20 mg total) by mouth daily. (Patient taking differently: Take 20 mg by mouth at bedtime. ) 30 tablet 2  . traMADol (ULTRAM) 50 MG tablet Take 50 mg by mouth daily as needed for moderate pain.      No current facility-administered medications for this encounter.    Facility-Administered Medications Ordered in Other Encounters  Medication Dose Route Frequency Provider Last Rate Last Dose  . dextrose 5 % and 0.9 % NaCl with KCl 20 mEq/L infusion   Intravenous Continuous Autumn Messing III, MD      . heparin injection 5,000 Units  5,000 Units Subcutaneous Q8H Autumn Messing III, MD      . HYDROcodone-acetaminophen (NORCO/VICODIN) 5-325 MG per tablet 1-2 tablet  1-2 tablet Oral Q4H PRN Autumn Messing III, MD      . ondansetron (ZOFRAN-ODT) disintegrating tablet 4 mg  4 mg Oral Q6H PRN Autumn Messing III, MD       Or  . ondansetron (ZOFRAN) 4 mg in sodium chloride 0.9 % 50 mL IVPB  4 mg Intravenous Q6H PRN Autumn Messing III, MD      . pantoprazole (PROTONIX) injection 40 mg  40 mg Intravenous QHS Autumn Messing III, MD          ALLERGIES: Patient has no known allergies.   LABORATORY DATA:  Lab Results  Component Value Date   WBC 5.5 05/04/2016   HGB 12.1 05/04/2016   HCT 35.1 (L) 05/04/2016   MCV 91.9 05/04/2016   PLT 267 05/04/2016   Lab Results  Component Value Date   NA 141 05/04/2016   K 3.9 05/04/2016   CL 107 05/04/2016   CO2 27 05/04/2016   Lab Results  Component Value Date   ALT 20 03/23/2016   AST 21 03/23/2016   ALKPHOS 79 03/23/2016   BILITOT 0.42 03/23/2016     NARRATIVE: Bethany Gardner was seen today for weekly treatment management. The chart was checked and the patient's films were reviewed.  The patient is doing very well after her first week of treatment. No major difficulties.  PHYSICAL EXAMINATION: oral temperature is 97.6 F (36.4 C). Her blood pressure is 119/82 and her pulse is 82. Her respiration is 20.        ASSESSMENT: The patient is doing satisfactorily with treatment.  PLAN: We will continue with the patient's radiation treatment as planned.

## 2016-05-30 ENCOUNTER — Ambulatory Visit
Admission: RE | Admit: 2016-05-30 | Discharge: 2016-05-30 | Disposition: A | Discharge status: Home / Self Care | Payer: Managed Care, Other (non HMO) | Source: Ambulatory Visit | Attending: Radiation Oncology | Admitting: Radiation Oncology

## 2016-05-30 DIAGNOSIS — Z510 Encounter for antineoplastic radiation therapy: Secondary | ICD-10-CM | POA: Diagnosis not present

## 2016-05-31 ENCOUNTER — Other Ambulatory Visit: Payer: Self-pay | Admitting: Otolaryngology

## 2016-05-31 ENCOUNTER — Ambulatory Visit
Admission: RE | Admit: 2016-05-31 | Discharge: 2016-05-31 | Disposition: A | Discharge status: Home / Self Care | Payer: Managed Care, Other (non HMO) | Source: Ambulatory Visit | Attending: Radiation Oncology | Admitting: Radiation Oncology

## 2016-05-31 DIAGNOSIS — H93A1 Pulsatile tinnitus, right ear: Secondary | ICD-10-CM

## 2016-05-31 DIAGNOSIS — Z510 Encounter for antineoplastic radiation therapy: Secondary | ICD-10-CM | POA: Diagnosis not present

## 2016-06-01 ENCOUNTER — Ambulatory Visit
Admission: RE | Admit: 2016-06-01 | Discharge: 2016-06-01 | Disposition: A | Discharge status: Home / Self Care | Payer: Managed Care, Other (non HMO) | Source: Ambulatory Visit | Attending: Radiation Oncology | Admitting: Radiation Oncology

## 2016-06-01 DIAGNOSIS — Z510 Encounter for antineoplastic radiation therapy: Secondary | ICD-10-CM | POA: Diagnosis not present

## 2016-06-02 ENCOUNTER — Ambulatory Visit
Admission: RE | Admit: 2016-06-02 | Discharge: 2016-06-02 | Disposition: A | Discharge status: Home / Self Care | Payer: Managed Care, Other (non HMO) | Source: Ambulatory Visit | Attending: Radiation Oncology | Admitting: Radiation Oncology

## 2016-06-02 DIAGNOSIS — Z510 Encounter for antineoplastic radiation therapy: Secondary | ICD-10-CM | POA: Diagnosis not present

## 2016-06-03 ENCOUNTER — Other Ambulatory Visit: Payer: Managed Care, Other (non HMO)

## 2016-06-03 ENCOUNTER — Encounter: Payer: Self-pay | Admitting: Radiation Oncology

## 2016-06-03 ENCOUNTER — Ambulatory Visit
Admission: RE | Admit: 2016-06-03 | Discharge: 2016-06-03 | Disposition: A | Discharge status: Home / Self Care | Payer: Managed Care, Other (non HMO) | Source: Ambulatory Visit | Attending: Radiation Oncology | Admitting: Radiation Oncology

## 2016-06-03 VITALS — BP 117/69 | HR 81 | Temp 97.8°F | Resp 20

## 2016-06-03 DIAGNOSIS — C50312 Malignant neoplasm of lower-inner quadrant of left female breast: Secondary | ICD-10-CM

## 2016-06-03 DIAGNOSIS — Z170 Estrogen receptor positive status [ER+]: Principal | ICD-10-CM

## 2016-06-03 DIAGNOSIS — Z510 Encounter for antineoplastic radiation therapy: Secondary | ICD-10-CM | POA: Diagnosis not present

## 2016-06-03 NOTE — Progress Notes (Signed)
Weekly rad txs left breast, 10/35 completed, mild erythema, skin intact, using radiaplex bid, asking if she can lift weights, also about if she should get a lymphedema, , had 4 lymph nodes removed stated, went to ABC class, refuses to be weighed 10:32 AM BP 117/69 (BP Location: Right Arm, Patient Position: Sitting, Cuff Size: Normal)   Pulse 81   Temp 97.8 F (36.6 C) (Oral)   Resp 20

## 2016-06-05 NOTE — Progress Notes (Signed)
Department of Radiation Oncology  Phone:  [phone] Fax:        [phone]  Weekly Treatment Note    Name: Bethany Gardner Date: 06/05/2016 MRN: [id number] DOB: 06-11-1960   Diagnosis:     ICD-9-CM ICD-10-CM   1. Malignant neoplasm of lower-inner quadrant of left breast in female, estrogen receptor positive ([place]) 174.3 C50.312    V86.0 Z17.0      Current dose: 18 Gy  Current fraction: 10   MEDICATIONS: Current Outpatient Prescriptions  Medication Sig Dispense Refill  . hyaluronate sodium (RADIAPLEXRX) GEL Apply 1 application topically 2 (two) times daily.    Marland Kitchen ibuprofen (ADVIL,MOTRIN) 200 MG tablet Take 200-400 mg by mouth every 6 (six) hours as needed for pain.     . meclizine (ANTIVERT) 25 MG tablet Take 25 mg by mouth as needed for dizziness.    . non-metallic deodorant Jethro Poling) MISC Apply 1 application topically daily as needed.    . tamoxifen (NOLVADEX) 20 MG tablet Take 1 tablet (20 mg total) by mouth daily. (Patient taking differently: Take 20 mg by mouth at bedtime. ) 30 tablet 2  . traMADol (ULTRAM) 50 MG tablet Take 50 mg by mouth daily as needed for moderate pain.      No current facility-administered medications for this encounter.    Facility-Administered Medications Ordered in Other Encounters  Medication Dose Route Frequency Provider Last Rate Last Dose  . dextrose 5 % and 0.9 % NaCl with KCl 20 mEq/L infusion   Intravenous Continuous Autumn Messing III, MD      . heparin injection 5,000 Units  5,000 Units Subcutaneous Q8H Autumn Messing III, MD      . HYDROcodone-acetaminophen (NORCO/VICODIN) 5-325 MG per tablet 1-2 tablet  1-2 tablet Oral Q4H PRN Autumn Messing III, MD      . ondansetron (ZOFRAN-ODT) disintegrating tablet 4 mg  4 mg Oral Q6H PRN Autumn Messing III, MD       Or  . ondansetron (ZOFRAN) 4 mg in sodium chloride 0.9 % 50 mL IVPB  4 mg Intravenous Q6H PRN Autumn Messing III, MD      . pantoprazole (PROTONIX) injection 40 mg  40 mg Intravenous QHS Autumn Messing III, MD          ALLERGIES: Patient has no known allergies.   LABORATORY DATA:  Lab Results  Component Value Date   WBC 5.5 05/04/2016   HGB 12.1 05/04/2016   HCT 35.1 (L) 05/04/2016   MCV 91.9 05/04/2016   PLT 267 05/04/2016   Lab Results  Component Value Date   NA 141 05/04/2016   K 3.9 05/04/2016   CL 107 05/04/2016   CO2 27 05/04/2016   Lab Results  Component Value Date   ALT 20 03/23/2016   AST 21 03/23/2016   ALKPHOS 79 03/23/2016   BILITOT 0.42 03/23/2016     NARRATIVE: Bethany Gardner was seen today for weekly treatment management. The chart was checked and the patient's films were reviewed.  The patient states that she continues to do well. She she had questions about level of activity.    Weekly rad txs left breast, 10/35 completed, mild erythema, skin intact, using radiaplex bid, asking if she can lift weights, also about if she should get a lymphedema, , had 4 lymph nodes removed stated, went to ABC class, refuses to be weighed 2:49 PM BP 117/69 (BP Location: Right Arm, Patient Position: Sitting, Cuff Size: Normal)   Pulse 81   Temp 97.8 F (36.6  C) (Oral)   Resp 20     PHYSICAL EXAMINATION: oral temperature is 97.8 F (36.6 C). Her blood pressure is 117/69 and her pulse is 81. Her respiration is 20.        ASSESSMENT: The patient is doing satisfactorily with treatment.  PLAN: We will continue with the patient's radiation treatment as planned.

## 2016-06-06 ENCOUNTER — Ambulatory Visit
Admission: RE | Admit: 2016-06-06 | Discharge: 2016-06-06 | Disposition: A | Discharge status: Home / Self Care | Payer: Managed Care, Other (non HMO) | Source: Ambulatory Visit | Attending: Otolaryngology | Admitting: Otolaryngology

## 2016-06-06 ENCOUNTER — Ambulatory Visit
Admission: RE | Admit: 2016-06-06 | Discharge: 2016-06-06 | Disposition: A | Discharge status: Home / Self Care | Payer: Managed Care, Other (non HMO) | Source: Ambulatory Visit | Attending: Radiation Oncology | Admitting: Radiation Oncology

## 2016-06-06 DIAGNOSIS — Z510 Encounter for antineoplastic radiation therapy: Secondary | ICD-10-CM | POA: Diagnosis not present

## 2016-06-06 DIAGNOSIS — H93A1 Pulsatile tinnitus, right ear: Secondary | ICD-10-CM

## 2016-06-06 MED ORDER — IOPAMIDOL (ISOVUE-370) INJECTION 76%
80.0000 mL | Freq: Once | INTRAVENOUS | Status: AC | PRN
  Administered 2016-06-06: 80 mL via INTRAVENOUS

## 2016-06-07 ENCOUNTER — Ambulatory Visit
Admission: RE | Admit: 2016-06-07 | Discharge: 2016-06-07 | Disposition: A | Discharge status: Home / Self Care | Payer: Managed Care, Other (non HMO) | Source: Ambulatory Visit | Attending: Radiation Oncology | Admitting: Radiation Oncology

## 2016-06-07 DIAGNOSIS — Z510 Encounter for antineoplastic radiation therapy: Secondary | ICD-10-CM | POA: Diagnosis not present

## 2016-06-08 ENCOUNTER — Ambulatory Visit
Admission: RE | Admit: 2016-06-08 | Discharge: 2016-06-08 | Disposition: A | Discharge status: Home / Self Care | Payer: Managed Care, Other (non HMO) | Source: Ambulatory Visit | Attending: Radiation Oncology | Admitting: Radiation Oncology

## 2016-06-08 DIAGNOSIS — Z510 Encounter for antineoplastic radiation therapy: Secondary | ICD-10-CM | POA: Diagnosis not present

## 2016-06-09 ENCOUNTER — Ambulatory Visit
Admission: RE | Admit: 2016-06-09 | Discharge: 2016-06-09 | Disposition: A | Discharge status: Home / Self Care | Payer: Managed Care, Other (non HMO) | Source: Ambulatory Visit | Attending: Radiation Oncology | Admitting: Radiation Oncology

## 2016-06-09 DIAGNOSIS — Z510 Encounter for antineoplastic radiation therapy: Secondary | ICD-10-CM | POA: Diagnosis not present

## 2016-06-10 ENCOUNTER — Ambulatory Visit
Admission: RE | Admit: 2016-06-10 | Discharge: 2016-06-10 | Disposition: A | Discharge status: Home / Self Care | Payer: Managed Care, Other (non HMO) | Source: Ambulatory Visit | Attending: Radiation Oncology | Admitting: Radiation Oncology

## 2016-06-10 VITALS — BP 131/76 | HR 81 | Temp 98.5°F | Resp 16

## 2016-06-10 DIAGNOSIS — Z510 Encounter for antineoplastic radiation therapy: Secondary | ICD-10-CM | POA: Diagnosis not present

## 2016-06-10 DIAGNOSIS — C50312 Malignant neoplasm of lower-inner quadrant of left female breast: Secondary | ICD-10-CM | POA: Diagnosis present

## 2016-06-10 DIAGNOSIS — D0512 Intraductal carcinoma in situ of left breast: Secondary | ICD-10-CM

## 2016-06-10 DIAGNOSIS — Z170 Estrogen receptor positive status [ER+]: Secondary | ICD-10-CM | POA: Insufficient documentation

## 2016-06-10 MED ORDER — RADIAPLEXRX EX GEL
Freq: Once | CUTANEOUS | Status: AC
  Administered 2016-06-10: 11:00:00 via TOPICAL

## 2016-06-10 NOTE — Progress Notes (Signed)
Weekly rad tx left breast 15/35 completed, swelling , mild erythema ,  Twinges in breast  At times, appetite good, mild fatigue, gave another radiaplex tube, using bid 10:57 AM BP 131/76 (BP Location: Right Arm, Patient Position: Sitting, Cuff Size: Normal)   Pulse 81   Temp 98.5 F (36.9 C) (Oral)   Resp 16   Wt Readings from Last 3 Encounters:  05/12/16 188 lb 3.2 oz (85.4 kg)  05/04/16 199 lb 11.8 oz (90.6 kg)  04/15/16 199 lb (90.3 kg)

## 2016-06-10 NOTE — Progress Notes (Signed)
Department of Radiation Oncology  Phone:  [phone] Fax:        [phone]  Weekly Treatment Note    Name: Bethany Gardner Date: 06/10/2016 MRN: [id number] DOB: 1960/11/09   Diagnosis:     ICD-9-CM ICD-10-CM   1. Ductal carcinoma in situ (DCIS) of left breast 233.0 D05.12 hyaluronate sodium (RADIAPLEXRX) gel  2. Malignant neoplasm of lower-inner quadrant of left breast in female, estrogen receptor positive (HCC) 174.3 C50.312    V86.0 Z17.0      Current dose: 27 Gy  Current fraction: 15   MEDICATIONS: Current Outpatient Prescriptions  Medication Sig Dispense Refill  . hyaluronate sodium (RADIAPLEXRX) GEL Apply 1 application topically 2 (two) times daily.    Marland Kitchen ibuprofen (ADVIL,MOTRIN) 200 MG tablet Take 200-400 mg by mouth every 6 (six) hours as needed for pain.     . meclizine (ANTIVERT) 25 MG tablet Take 25 mg by mouth as needed for dizziness.    . non-metallic deodorant Jethro Poling) MISC Apply 1 application topically daily as needed.    . tamoxifen (NOLVADEX) 20 MG tablet Take 1 tablet (20 mg total) by mouth daily. (Patient taking differently: Take 20 mg by mouth at bedtime. ) 30 tablet 2  . traMADol (ULTRAM) 50 MG tablet Take 50 mg by mouth daily as needed for moderate pain.      No current facility-administered medications for this encounter.    Facility-Administered Medications Ordered in Other Encounters  Medication Dose Route Frequency Provider Last Rate Last Dose  . dextrose 5 % and 0.9 % NaCl with KCl 20 mEq/L infusion   Intravenous Continuous Autumn Messing III, MD      . heparin injection 5,000 Units  5,000 Units Subcutaneous Q8H Autumn Messing III, MD      . HYDROcodone-acetaminophen (NORCO/VICODIN) 5-325 MG per tablet 1-2 tablet  1-2 tablet Oral Q4H PRN Autumn Messing III, MD      . ondansetron (ZOFRAN-ODT) disintegrating tablet 4 mg  4 mg Oral Q6H PRN Autumn Messing III, MD       Or  . ondansetron (ZOFRAN) 4 mg in sodium chloride 0.9 % 50 mL IVPB  4 mg Intravenous Q6H PRN Autumn Messing III, MD      . pantoprazole (PROTONIX) injection 40 mg  40 mg Intravenous QHS Autumn Messing III, MD         ALLERGIES: Patient has no allergy information on record.   LABORATORY DATA:  Lab Results  Component Value Date   WBC 5.5 05/04/2016   HGB 12.1 05/04/2016   HCT 35.1 (L) 05/04/2016   MCV 91.9 05/04/2016   PLT 267 05/04/2016   Lab Results  Component Value Date   NA 141 05/04/2016   K 3.9 05/04/2016   CL 107 05/04/2016   CO2 27 05/04/2016   Lab Results  Component Value Date   ALT 20 03/23/2016   AST 21 03/23/2016   ALKPHOS 79 03/23/2016   BILITOT 0.42 03/23/2016     NARRATIVE: Jodi Geralds was seen today for weekly treatment management. The chart was checked and the patient's films were reviewed.The patient has been experiencing some fatigue. Doing well. Moderate skin irritation developing.   Weekly rad tx left breast 15/35 completed, swelling , mild erythema ,  Twinges in breast  At times, appetite good, mild fatigue, gave another radiaplex tube, using bid 12:14 PM BP 131/76 (BP Location: Right Arm, Patient Position: Sitting, Cuff Size: Normal)   Pulse 81   Temp 98.5 F (36.9 C) (Oral)  Resp 16   Wt Readings from Last 3 Encounters:  05/12/16 188 lb 3.2 oz (85.4 kg)  05/04/16 199 lb 11.8 oz (90.6 kg)  04/15/16 199 lb (90.3 kg)    PHYSICAL EXAMINATION: oral temperature is 98.5 F (36.9 C). Her blood pressure is 131/76 and her pulse is 81. Her respiration is 16.      diffuse erythema in the treatment area. No desquamation.   ASSESSMENT: The patient is doing satisfactorily with treatment.  PLAN: We will continue with the patient's radiation treatment as planned.

## 2016-06-13 ENCOUNTER — Ambulatory Visit
Admission: RE | Admit: 2016-06-13 | Discharge: 2016-06-13 | Disposition: A | Discharge status: Home / Self Care | Payer: Managed Care, Other (non HMO) | Source: Ambulatory Visit | Attending: Radiation Oncology | Admitting: Radiation Oncology

## 2016-06-13 DIAGNOSIS — Z510 Encounter for antineoplastic radiation therapy: Secondary | ICD-10-CM | POA: Diagnosis not present

## 2016-06-14 ENCOUNTER — Ambulatory Visit
Admission: RE | Admit: 2016-06-14 | Discharge: 2016-06-14 | Disposition: A | Discharge status: Home / Self Care | Payer: Managed Care, Other (non HMO) | Source: Ambulatory Visit | Attending: Radiation Oncology | Admitting: Radiation Oncology

## 2016-06-14 DIAGNOSIS — Z510 Encounter for antineoplastic radiation therapy: Secondary | ICD-10-CM | POA: Diagnosis not present

## 2016-06-15 ENCOUNTER — Encounter: Payer: Self-pay | Admitting: Radiation Oncology

## 2016-06-15 ENCOUNTER — Ambulatory Visit
Admission: RE | Admit: 2016-06-15 | Discharge: 2016-06-15 | Disposition: A | Discharge status: Home / Self Care | Payer: Managed Care, Other (non HMO) | Source: Ambulatory Visit | Attending: Radiation Oncology | Admitting: Radiation Oncology

## 2016-06-15 DIAGNOSIS — Z510 Encounter for antineoplastic radiation therapy: Secondary | ICD-10-CM | POA: Diagnosis not present

## 2016-06-16 ENCOUNTER — Ambulatory Visit
Admission: RE | Admit: 2016-06-16 | Discharge: 2016-06-16 | Disposition: A | Discharge status: Home / Self Care | Payer: Managed Care, Other (non HMO) | Source: Ambulatory Visit | Attending: Radiation Oncology | Admitting: Radiation Oncology

## 2016-06-16 DIAGNOSIS — C50312 Malignant neoplasm of lower-inner quadrant of left female breast: Secondary | ICD-10-CM | POA: Diagnosis present

## 2016-06-16 DIAGNOSIS — Z170 Estrogen receptor positive status [ER+]: Secondary | ICD-10-CM | POA: Insufficient documentation

## 2016-06-16 DIAGNOSIS — Z510 Encounter for antineoplastic radiation therapy: Secondary | ICD-10-CM | POA: Diagnosis not present

## 2016-06-16 MED ORDER — BIAFINE EX EMUL
CUTANEOUS | Status: DC | PRN
  Administered 2016-06-16: 11:00:00 via TOPICAL

## 2016-06-17 ENCOUNTER — Encounter: Payer: Self-pay | Admitting: Radiation Oncology

## 2016-06-17 ENCOUNTER — Ambulatory Visit
Admission: RE | Admit: 2016-06-17 | Discharge: 2016-06-17 | Disposition: A | Discharge status: Home / Self Care | Payer: Managed Care, Other (non HMO) | Source: Ambulatory Visit | Attending: Radiation Oncology | Admitting: Radiation Oncology

## 2016-06-17 DIAGNOSIS — Z510 Encounter for antineoplastic radiation therapy: Secondary | ICD-10-CM | POA: Diagnosis not present

## 2016-06-20 ENCOUNTER — Ambulatory Visit
Admission: RE | Admit: 2016-06-20 | Discharge: 2016-06-20 | Disposition: A | Discharge status: Home / Self Care | Payer: Managed Care, Other (non HMO) | Source: Ambulatory Visit | Attending: Radiation Oncology | Admitting: Radiation Oncology

## 2016-06-20 ENCOUNTER — Encounter: Payer: Self-pay | Admitting: Radiation Oncology

## 2016-06-20 DIAGNOSIS — Z510 Encounter for antineoplastic radiation therapy: Secondary | ICD-10-CM | POA: Diagnosis not present

## 2016-06-21 ENCOUNTER — Ambulatory Visit (HOSPITAL_BASED_OUTPATIENT_CLINIC_OR_DEPARTMENT_OTHER): Payer: Managed Care, Other (non HMO) | Admitting: Oncology

## 2016-06-21 ENCOUNTER — Ambulatory Visit
Admission: RE | Admit: 2016-06-21 | Discharge: 2016-06-21 | Disposition: A | Discharge status: Home / Self Care | Payer: Managed Care, Other (non HMO) | Source: Ambulatory Visit | Attending: Radiation Oncology | Admitting: Radiation Oncology

## 2016-06-21 VITALS — BP 112/55 | HR 83 | Temp 97.7°F | Resp 18 | Ht 66.0 in

## 2016-06-21 DIAGNOSIS — Z170 Estrogen receptor positive status [ER+]: Secondary | ICD-10-CM | POA: Diagnosis not present

## 2016-06-21 DIAGNOSIS — Z803 Family history of malignant neoplasm of breast: Secondary | ICD-10-CM | POA: Diagnosis not present

## 2016-06-21 DIAGNOSIS — Z808 Family history of malignant neoplasm of other organs or systems: Secondary | ICD-10-CM

## 2016-06-21 DIAGNOSIS — C50312 Malignant neoplasm of lower-inner quadrant of left female breast: Secondary | ICD-10-CM

## 2016-06-21 DIAGNOSIS — D0512 Intraductal carcinoma in situ of left breast: Secondary | ICD-10-CM | POA: Diagnosis not present

## 2016-06-21 DIAGNOSIS — Z510 Encounter for antineoplastic radiation therapy: Secondary | ICD-10-CM | POA: Diagnosis not present

## 2016-06-21 NOTE — Progress Notes (Signed)
[facility]  [phone] [phone]     ID: Bethany Gardner DOB: 01-20-1961  MR#: [id number]  ZJI#:[id number]  Patient Care Team: Lucretia Kern, DO as PCP - General (Family Medicine) Autumn Messing III, MD as Consulting Physician (General Surgery) Chauncey Cruel, MD as Consulting Physician (Oncology) Kyung Rudd, MD as Consulting Physician (Radiation Oncology) Salvadore Dom, MD as Consulting Physician (Obstetrics and Gynecology) Chauncey Cruel, MD OTHER MD:  CHIEF COMPLAINT: Ductal carcinoma in situ  CURRENT TREATMENT: Adjuvant radiation   BREAST CANCER HISTORY: From the original consult note:  Bethany Gardner had screening mammography with tomography at the [facility] 03/08/2016 showing suspicious calcifications in the left breast. Left diagnostic mammography with tomography and left breast ultrasonography 03/14/2016 found the breast density to be category B. In the lower inner quadrant of the left breast there were calcifications measuring 2.1 cm, in a linear arrangement. There was also an area of asymmetry but no palpable abnormality on exam. Ultrasonography of the breast was noninformative an ultrasound of the breast was mammographically benign.  Biopsy of the area of calcifications 03/14/2016 showed ([id number]) ductal carcinoma in situ, grade 3, involving a complex sclerosing lesion. Some areas were suspicious but not diagnostic for invasion. This was estrogen receptor positive at 60% with moderate staining, but progesterone receptor negative.  Her subsequent history is as detailed below  INTERVAL HISTORY: Bethany Gardner returns today for follow-up of her estrogen receptor positive breast cancer accompanied by her husband. After her visit here, she underwent left lumpectomy with sentinel lymph node sampling, on 04/15/2016. The final pathology ([id number]) showed ductal carcinoma in situ, high-grade, focally involving the lateral margin, but with further  surgery the same day within 1 mm of the final lateral margin clearance. All 4 sentinel lymph nodes were clear.  She then proceeded to adjuvant radiation which is ongoing and scheduled to be completed 07/08/2013. At this point she is ready to start considering anti-estrogens.  Since her last visit here she has also found more information on her family including the 5.her mother's mother and 2 of her mother's mother's sisters had breast cancer. The patient's own sister and a history of melanoma. There is no history of ovarian cancer in the family that she  REVIEW OF SYSTEMS: She did well with her surgery although there was a postoperative infection requiring intravenous antibiotics for a brief stay in the hospital and then a doxycycline tail. She is doing well with radiation, although she has had some pain in the nipple which is like a needle she says. There has been no discharge. She has not had blister problems. is tolerating the radiation well. She is not exercising regularly. A detailed review of systems today was otherwise stable  PAST MEDICAL HISTORY: Past Medical History:  Diagnosis Date  . Anemia    father has Factor V Leiden-no issues with it. Pt has not been tested  . Endometriosis   . Fibroid   . Foot pain    bilateral - occ foot pain  . Infertility, female   . Malignant neoplasm of lower-inner quadrant of left female breast ([place]) 03/16/2016   left breast DCIS  . Obesity     PAST SURGICAL HISTORY: Past Surgical History:  Procedure Laterality Date  . ABDOMINAL HYSTERECTOMY    . BREAST BIOPSY Left    benign  . BREAST LUMPECTOMY WITH RADIOACTIVE SEED AND SENTINEL LYMPH NODE BIOPSY Left 04/15/2016   Procedure: BREAST LUMPECTOMY WITH RADIOACTIVE SEED AND SENTINEL LYMPH NODE BIOPSY;  Surgeon: Autumn Messing III, MD;  Location: [facility];  Service: General;  Laterality: Left;  BREAST LUMPECTOMY WITH RADIOACTIVE SEED AND SENTINEL LYMPH NODE BIOPSY  . DILATION AND CURETTAGE OF  UTERUS    . laproscopy     three  . TONSILLECTOMY      FAMILY HISTORY Family History  Problem Relation Age of Onset  . Heart disease Father   . Heart attack Father 27  . Transient ischemic attack Mother   . Stroke Mother   . Breast cancer Maternal Grandmother   . Melanoma Sister   . Colon cancer Neg Hx   . Colon polyps Neg Hx   . Esophageal cancer Neg Hx   . Rectal cancer Neg Hx   . Stomach cancer Neg Hx   The patient's parents are living, in their late 38s as of December 2017. The patient has one brother, 2 sisters. The patient's sister was diagnosed with melanoma at age 46. There is no history of breast or ovarian cancer in the family.   GYNECOLOGIC HISTORY:  No LMP recorded. Patient has had a hysterectomy. Menarche age 44, first live birth age 71. The patient is GX P1. She underwent hysterectomy with no salpingo-oophorectomy in 2010. She did not take hormone replacement. 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 Her husband Waunita Schooner works as Health and safety inspector for [employer]. They moved here from the [facility] area in 2016. The patient's daughter patent is an Statistician in [place]. The patient's adopted son Laurann Montana lives at home with the patient.     ADVANCED DIRECTIVES: not in place   HEALTH MAINTENANCE: Social History  Substance Use Topics  . Smoking status: Never Smoker  . Smokeless tobacco: Never Used  . Alcohol use 4.2 oz/week    4 Glasses of wine, 3 Standard drinks or equivalent per week     Comment: occasionally     Colonoscopy:October 2016, [facility]  PAP:  Bone density:   Not on File  Current Outpatient Prescriptions  Medication Sig Dispense Refill  . hyaluronate sodium (RADIAPLEXRX) GEL Apply 1 application topically 2 (two) times daily.    Marland Kitchen ibuprofen (ADVIL,MOTRIN) 200 MG tablet Take 200-400 mg by mouth every  6 (six) hours as needed for pain.     . meclizine (ANTIVERT) 25 MG tablet Take 25 mg by mouth as needed for dizziness.    . non-metallic deodorant Jethro Poling) MISC Apply 1 application topically daily as needed.    . tamoxifen (NOLVADEX) 20 MG tablet Take 1 tablet (20 mg total) by mouth daily. (Patient taking differently: Take 20 mg by mouth at bedtime. ) 30 tablet 2  . traMADol (ULTRAM) 50 MG tablet Take 50 mg by mouth daily as needed for moderate pain.      No current facility-administered medications for this visit.    Facility-Administered Medications Ordered in Other Visits  Medication Dose Route Frequency Provider Last Rate Last Dose  . dextrose 5 % and 0.9 % NaCl with KCl 20 mEq/L infusion   Intravenous Continuous Autumn Messing III, MD      . heparin injection 5,000 Units  5,000 Units Subcutaneous Q8H Autumn Messing III, MD      . HYDROcodone-acetaminophen (NORCO/VICODIN) 5-325 MG per tablet 1-2 tablet  1-2 tablet Oral Q4H PRN Autumn Messing III, MD      . ondansetron (ZOFRAN-ODT)  disintegrating tablet 4 mg  4 mg Oral Q6H PRN Autumn Messing III, MD       Or  . ondansetron (ZOFRAN) 4 mg in sodium chloride 0.9 % 50 mL IVPB  4 mg Intravenous Q6H PRN Autumn Messing III, MD      . pantoprazole (PROTONIX) injection 40 mg  40 mg Intravenous QHS Autumn Messing III, MD        OBJECTIVE:  Middle aged white woman who appears well   Vitals:   06/21/16 1341  BP: (!) 112/55  Pulse: 83  Resp: 18  Temp: 97.7 F (36.5 C)     There is no height or weight on file to calculate BMI.    ECOG FS:0 - Asymptomatic  Sclerae unicteric, pupils round and equal Oropharynx clear and moist-- no thrush or other lesions No cervical or supraclavicular adenopathy Lungs no rales or rhonchi Heart regular rate and rhythm Abd soft, nontender, positive bowel sounds MSK no focal spinal tenderness, no upper extremity lymphedema Neuro: nonfocal, well oriented, appropriate affect Breasts: The right breast is benign. The left breast is currently  undergoing radiation after lumpectomy. There is erythema but no desquamation. The cosmetic result is good. Both axillae are benign.   LAB RESULTS:  CMP     Component Value Date/Time   NA 141 05/04/2016 1354   NA 141 03/23/2016 1240   K 3.9 05/04/2016 1354   K 4.7 03/23/2016 1240   CL 107 05/04/2016 1354   CO2 27 05/04/2016 1354   CO2 25 03/23/2016 1240   GLUCOSE 131 (H) 05/04/2016 1354   GLUCOSE 97 03/23/2016 1240   BUN 9 05/04/2016 1354   BUN 13.0 03/23/2016 1240   CREATININE 0.67 05/04/2016 1354   CREATININE 0.7 03/23/2016 1240   CALCIUM 9.2 05/04/2016 1354   CALCIUM 9.6 03/23/2016 1240   PROT 7.8 03/23/2016 1240   ALBUMIN 4.0 03/23/2016 1240   AST 21 03/23/2016 1240   ALT 20 03/23/2016 1240   ALKPHOS 79 03/23/2016 1240   BILITOT 0.42 03/23/2016 1240   GFRNONAA >60 05/04/2016 1354   GFRAA >60 05/04/2016 1354    INo results found for: SPEP, UPEP  Lab Results  Component Value Date   WBC 5.5 05/04/2016   NEUTROABS 3.5 05/04/2016   HGB 12.1 05/04/2016   HCT 35.1 (L) 05/04/2016   MCV 91.9 05/04/2016   PLT 267 05/04/2016      Chemistry      Component Value Date/Time   NA 141 05/04/2016 1354   NA 141 03/23/2016 1240   K 3.9 05/04/2016 1354   K 4.7 03/23/2016 1240   CL 107 05/04/2016 1354   CO2 27 05/04/2016 1354   CO2 25 03/23/2016 1240   BUN 9 05/04/2016 1354   BUN 13.0 03/23/2016 1240   CREATININE 0.67 05/04/2016 1354   CREATININE 0.7 03/23/2016 1240      Component Value Date/Time   CALCIUM 9.2 05/04/2016 1354   CALCIUM 9.6 03/23/2016 1240   ALKPHOS 79 03/23/2016 1240   AST 21 03/23/2016 1240   ALT 20 03/23/2016 1240   BILITOT 0.42 03/23/2016 1240       No results found for: LABCA2  No components found for: LABCA125  No results for input(s): INR in the last 168 hours.  Urinalysis No results found for: COLORURINE, APPEARANCEUR, LABSPEC, PHURINE, GLUCOSEU, HGBUR, BILIRUBINUR, KETONESUR, PROTEINUR, UROBILINOGEN, NITRITE,  LEUKOCYTESUR   STUDIES: Ct Angio Head W Or Wo Contrast  Result Date: 06/06/2016 CLINICAL DATA:  Right-sided pulsatile tinnitus. History  of breast cancer. EXAM: CT ANGIOGRAPHY HEAD TECHNIQUE: Multidetector CT imaging of the head was performed using the standard protocol during bolus administration of intravenous contrast. Multiplanar CT image reconstructions and MIPs were obtained to evaluate the vascular anatomy. CONTRAST:  80 mL Isovue 370 COMPARISON:  CTA neck 05/27/2016 FINDINGS: CT HEAD Brain: There is no evidence of acute cortical infarct, intracranial hemorrhage, mass, midline shift, or extra-axial fluid collection. The ventricles and sulci are normal. Vascular: No hyperdense vessel. Skull: No fracture or focal osseous lesion. Sinuses: Small right maxillary sinus mucous retention cyst. Clear mastoid air cells. Orbits: Unremarkable. CTA HEAD Anterior circulation: The internal carotid arteries are widely patent from skullbase to carotid termini. There is symmetric osseous thinning of the lateral carotid canal bilaterally without the internal carotid arteries protruding into or coursing through the middle ear cavities. ACAs and MCAs are patent without evidence of major branch occlusion or significant stenosis. No aneurysm. Posterior circulation: The visualized distal vertebral arteries are widely patent with the left minimally larger than the right. AICA is are patent and prominent in size. SCA origins are patent. Basilar artery is widely patent. There are small posterior communicating arteries bilaterally. PCAs are patent without evidence of significant stenosis. No aneurysm. Venous sinuses: Patent. Anatomic variants: None. Delayed phase: No abnormal enhancement. IMPRESSION: Unremarkable head CTA aside from symmetric osseous thinning of the carotid canals bilaterally. The internal carotid arteries do not protrude into the middle ear cavities. Electronically Signed   By: Logan Bores M.D.   On: 06/06/2016  13:06   Ct Angio Neck W Or Wo Contrast  Result Date: 05/27/2016 CLINICAL DATA:  Pulsatile tinnitus right ear. History of breast cancer EXAM: CT ANGIOGRAPHY NECK TECHNIQUE: Multidetector CT imaging of the neck was performed using the standard protocol during bolus administration of intravenous contrast. Multiplanar CT image reconstructions and MIPs were obtained to evaluate the vascular anatomy. Carotid stenosis measurements (when applicable) are obtained utilizing NASCET criteria, using the distal internal carotid diameter as the denominator. CONTRAST:  80 mL Isovue 370 IV COMPARISON:  None. FINDINGS: Aortic arch: Normal Right carotid system: Normal Left carotid system: Normal Vertebral arteries: Both vertebral arteries appear normal and contribute to the basilar. Negative for dissection. Skeleton: Negative. Mastoid sinus clear bilaterally. Paranasal sinuses clear. Normal appearing skullbase. Other neck: Left thyroid nodule measures 8 mm with small adjacent calcifications. Right thyroid lobe normal. Upper chest: Negative IMPRESSION: Negative CTA of the neck. Bilateral carotid artery and vertebral artery is normal. No dissection stenosis or aneurysm identified. 8 mm left thyroid nodule with adjacent calcifications. Thyroid ultrasound recommended for further evaluation. Electronically Signed   By: Franchot Gallo M.D.   On: 05/27/2016 15:37    ELIGIBLE FOR AVAILABLE RESEARCH PROTOCOL:  no  ASSESSMENT: 56 y.o. [place] woman status post left breast lower inner quadrant biopsy 03/14/2016 for ductal carcinoma in situ, grade 3, estrogen receptor positive, progesterone receptor negative  (1) breast conserving surgery with sentinel lymph node sampling planned  (2) adjuvant radiation to be completed 07/09/2015  (3)  Tamoxifen started 03/24/2016 neoadjuvantly, held during radiation  (4) the patient qualifies for genetics testing.  PLAN: Bethany Gardner is doing generally well with her radiation and  will soon complete her adjuvant treatments. 2 weeks after stopping radiation she may resume her tamoxifen, which she was tolerating well, with no significant hot flashes and minimal vaginal wetness.  She had many questions regarding her margins, her risk of recurrence, Marcello Moores testing to do regarding the possibility of metastatic spread him  a and questions regarding genetics testing.  I reassured her that although her margins were close they were negative and that the recent data on ductal carcinoma in situ shows that patients with close but negative margins who received radiation to just as well as patients with wider margins in terms of local control.  We discussed the fact that her tumor cannot recur outside the breast. Her risk of recurrence in the breast without radiation would've been approximately 30%, radiation bring that down to about 16%, and her taking tamoxifen cutting it back further to about 6%.  Tamoxifen also will reduce her risk of developing a new breast cancer from approximately 1% per year to approximately 1/2% per year.  We discussed the fact that screening tests for metastatic disease does not improve survival or length in life but it does cause significant exposure to radiation and may expose the patient to false-positive requiring invasive procedures. After this discussion she is satisfied being followed through the standard protocol.  We also discussed the mechanics of proceeding through genetics testing and what she is likely to learn from those tests.  Accordingly she will return to see me in August. She will have a mammography before that visit. She knows to call for any other problems that may develop before then.  Chauncey Cruel, MD   06/21/2016 2:13 PM Medical Oncology and Hematology [facility] [address] [place], [place] [address] Tel. [phone]    Fax. [phone]

## 2016-06-22 ENCOUNTER — Ambulatory Visit
Admission: RE | Admit: 2016-06-22 | Discharge: 2016-06-22 | Disposition: A | Discharge status: Home / Self Care | Payer: Managed Care, Other (non HMO) | Source: Ambulatory Visit | Attending: Radiation Oncology | Admitting: Radiation Oncology

## 2016-06-22 DIAGNOSIS — Z510 Encounter for antineoplastic radiation therapy: Secondary | ICD-10-CM | POA: Diagnosis not present

## 2016-06-23 ENCOUNTER — Ambulatory Visit
Admission: RE | Admit: 2016-06-23 | Discharge: 2016-06-23 | Disposition: A | Discharge status: Home / Self Care | Payer: Managed Care, Other (non HMO) | Source: Ambulatory Visit | Attending: Radiation Oncology | Admitting: Radiation Oncology

## 2016-06-23 DIAGNOSIS — Z510 Encounter for antineoplastic radiation therapy: Secondary | ICD-10-CM | POA: Diagnosis not present

## 2016-06-24 ENCOUNTER — Ambulatory Visit
Admission: RE | Admit: 2016-06-24 | Discharge: 2016-06-24 | Disposition: A | Discharge status: Home / Self Care | Payer: Managed Care, Other (non HMO) | Source: Ambulatory Visit | Attending: Radiation Oncology | Admitting: Radiation Oncology

## 2016-06-24 DIAGNOSIS — Z510 Encounter for antineoplastic radiation therapy: Secondary | ICD-10-CM | POA: Diagnosis not present

## 2016-06-24 NOTE — Progress Notes (Signed)
Complex simulation note  Diagnosis: left-sided breast cancer  Narrative The patient has initially been planned to receive a course of whole breast radiation to a dose of 50.4 Gy in 28 fractions. The patient will now receive an additional boost to the seroma cavity which has been contoured. This will correspond to a boost of 14 Gy at 2 Gy per fraction. To accomplish this, an additional 3 customized blocks have been designed for this purpose. A complex isodose plan is requested to ensure that the target area is adequately covered with radiation dose and that the nearby normal structures such as the lung are adequately spared. The patient's final total dose will be 64.4 Gy.  ------------------------------------------------  Bethany Gross, MD, PhD

## 2016-06-27 ENCOUNTER — Ambulatory Visit
Admission: RE | Admit: 2016-06-27 | Discharge: 2016-06-27 | Disposition: A | Discharge status: Home / Self Care | Payer: Managed Care, Other (non HMO) | Source: Ambulatory Visit | Attending: Radiation Oncology | Admitting: Radiation Oncology

## 2016-06-27 ENCOUNTER — Ambulatory Visit: Payer: Managed Care, Other (non HMO) | Admitting: Radiation Oncology

## 2016-06-27 DIAGNOSIS — Z510 Encounter for antineoplastic radiation therapy: Secondary | ICD-10-CM | POA: Diagnosis not present

## 2016-06-28 ENCOUNTER — Ambulatory Visit
Admission: RE | Admit: 2016-06-28 | Discharge: 2016-06-28 | Disposition: A | Discharge status: Home / Self Care | Payer: Managed Care, Other (non HMO) | Source: Ambulatory Visit | Attending: Radiation Oncology | Admitting: Radiation Oncology

## 2016-06-28 DIAGNOSIS — Z510 Encounter for antineoplastic radiation therapy: Secondary | ICD-10-CM | POA: Diagnosis not present

## 2016-06-29 ENCOUNTER — Ambulatory Visit
Admission: RE | Admit: 2016-06-29 | Discharge: 2016-06-29 | Disposition: A | Discharge status: Home / Self Care | Payer: Managed Care, Other (non HMO) | Source: Ambulatory Visit | Attending: Radiation Oncology | Admitting: Radiation Oncology

## 2016-06-29 DIAGNOSIS — Z510 Encounter for antineoplastic radiation therapy: Secondary | ICD-10-CM | POA: Diagnosis not present

## 2016-06-30 ENCOUNTER — Ambulatory Visit
Admission: RE | Admit: 2016-06-30 | Discharge: 2016-06-30 | Disposition: A | Discharge status: Home / Self Care | Payer: Managed Care, Other (non HMO) | Source: Ambulatory Visit | Attending: Radiation Oncology | Admitting: Radiation Oncology

## 2016-06-30 DIAGNOSIS — Z510 Encounter for antineoplastic radiation therapy: Secondary | ICD-10-CM | POA: Diagnosis not present

## 2016-07-01 ENCOUNTER — Ambulatory Visit
Admission: RE | Admit: 2016-07-01 | Discharge: 2016-07-01 | Disposition: A | Discharge status: Home / Self Care | Payer: Managed Care, Other (non HMO) | Source: Ambulatory Visit | Attending: Radiation Oncology | Admitting: Radiation Oncology

## 2016-07-01 DIAGNOSIS — Z510 Encounter for antineoplastic radiation therapy: Secondary | ICD-10-CM | POA: Diagnosis not present

## 2016-07-04 ENCOUNTER — Ambulatory Visit
Admission: RE | Admit: 2016-07-04 | Discharge: 2016-07-04 | Disposition: A | Discharge status: Home / Self Care | Payer: Managed Care, Other (non HMO) | Source: Ambulatory Visit | Attending: Radiation Oncology | Admitting: Radiation Oncology

## 2016-07-04 ENCOUNTER — Telehealth: Payer: Self-pay | Admitting: *Deleted

## 2016-07-04 DIAGNOSIS — Z809 Family history of malignant neoplasm, unspecified: Secondary | ICD-10-CM

## 2016-07-04 DIAGNOSIS — C50312 Malignant neoplasm of lower-inner quadrant of left female breast: Secondary | ICD-10-CM

## 2016-07-04 DIAGNOSIS — Z170 Estrogen receptor positive status [ER+]: Secondary | ICD-10-CM

## 2016-07-04 DIAGNOSIS — Z510 Encounter for antineoplastic radiation therapy: Secondary | ICD-10-CM | POA: Diagnosis not present

## 2016-07-04 MED ORDER — TAMOXIFEN CITRATE 20 MG PO TABS: 20.0000 mg | ORAL_TABLET | Freq: Every day | ORAL | 12 refills | Status: DC

## 2016-07-04 NOTE — Telephone Encounter (Signed)
This RN spoke with pt per her call stating need for refill on Tamoxifen, as well as follow up with need for appointment for genetic testing.  Above obtained.

## 2016-07-05 ENCOUNTER — Ambulatory Visit
Admission: RE | Admit: 2016-07-05 | Discharge: 2016-07-05 | Disposition: A | Discharge status: Home / Self Care | Payer: Managed Care, Other (non HMO) | Source: Ambulatory Visit | Attending: Radiation Oncology | Admitting: Radiation Oncology

## 2016-07-05 DIAGNOSIS — Z510 Encounter for antineoplastic radiation therapy: Secondary | ICD-10-CM | POA: Diagnosis not present

## 2016-07-06 ENCOUNTER — Ambulatory Visit
Admission: RE | Admit: 2016-07-06 | Discharge: 2016-07-06 | Disposition: A | Discharge status: Home / Self Care | Payer: Managed Care, Other (non HMO) | Source: Ambulatory Visit | Attending: Radiation Oncology | Admitting: Radiation Oncology

## 2016-07-06 DIAGNOSIS — Z510 Encounter for antineoplastic radiation therapy: Secondary | ICD-10-CM | POA: Diagnosis not present

## 2016-07-07 ENCOUNTER — Ambulatory Visit
Admission: RE | Admit: 2016-07-07 | Discharge: 2016-07-07 | Disposition: A | Discharge status: Home / Self Care | Payer: Managed Care, Other (non HMO) | Source: Ambulatory Visit | Attending: Radiation Oncology | Admitting: Radiation Oncology

## 2016-07-07 DIAGNOSIS — Z510 Encounter for antineoplastic radiation therapy: Secondary | ICD-10-CM | POA: Diagnosis not present

## 2016-07-08 ENCOUNTER — Ambulatory Visit
Admission: RE | Admit: 2016-07-08 | Discharge: 2016-07-08 | Disposition: A | Discharge status: Home / Self Care | Payer: Managed Care, Other (non HMO) | Source: Ambulatory Visit | Attending: Radiation Oncology | Admitting: Radiation Oncology

## 2016-07-08 DIAGNOSIS — Z510 Encounter for antineoplastic radiation therapy: Secondary | ICD-10-CM | POA: Diagnosis not present

## 2016-07-11 ENCOUNTER — Encounter: Payer: Self-pay | Admitting: *Deleted

## 2016-07-12 ENCOUNTER — Other Ambulatory Visit: Payer: Managed Care, Other (non HMO)

## 2016-07-12 ENCOUNTER — Ambulatory Visit (HOSPITAL_BASED_OUTPATIENT_CLINIC_OR_DEPARTMENT_OTHER): Payer: Managed Care, Other (non HMO) | Admitting: Genetics

## 2016-07-12 DIAGNOSIS — D0512 Intraductal carcinoma in situ of left breast: Secondary | ICD-10-CM

## 2016-07-12 DIAGNOSIS — Z803 Family history of malignant neoplasm of breast: Secondary | ICD-10-CM

## 2016-07-12 DIAGNOSIS — Z808 Family history of malignant neoplasm of other organs or systems: Secondary | ICD-10-CM

## 2016-07-12 DIAGNOSIS — Z315 Encounter for genetic counseling: Secondary | ICD-10-CM

## 2016-07-12 DIAGNOSIS — Z853 Personal history of malignant neoplasm of breast: Secondary | ICD-10-CM

## 2016-07-13 ENCOUNTER — Encounter: Payer: Self-pay | Admitting: Genetics

## 2016-07-13 NOTE — Progress Notes (Signed)
REFERRING PROVIDER: Chauncey Cruel, MD [address] [place], [place] [address]  PRIMARY PROVIDER:  Lucretia Kern., DO  PRIMARY REASON FOR VISIT:  1. Personal history of breast cancer   2. Family history of breast cancer      HISTORY OF PRESENT ILLNESS:   Bethany Gardner, a 56 y.o. female, was seen for a Bethany Gardner cancer genetics consultation at the request of Bethany Gardner due to a personal and family history of cancer. Bethany Gardner states that she asked Bethany Gardner about genetic testing for hereditary cancer syndromes and after review of her family history, he agreed that a referral to genetic counseling may be of benefit.  Bethany Gardner presents to clinic today to discuss the possibility of a hereditary predisposition to cancer, genetic testing, and to further clarify her future cancer risks, as well as potential cancer risks for family members.   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 This was treated with neoadjuvant Tamoxifen, lumpectomy, radiation, and plans for adjuvant Tamoxifen to resume 08/09/2016.   HORMONAL RISK FACTORS:  Menarche was at age 68.  First live birth at age 70.  OCP use for approximately unassessed.  Ovaries intact: yes.  Hysterectomy: yes.  Menopausal status: unknown.  HRT use: 0 years. Colonoscopy: yes; abnormal. Last colonoscopy 01/2015 revealed 2 polyps. Mammogram within the last year: yes. Number of breast biopsies: unassessed. Up to date with pelvic exams:  yes. Any excessive radiation exposure in the past:  no  Past Medical History:  Diagnosis Date  . Anemia    father has Factor V Leiden-no issues with it. Pt has not been tested  . Endometriosis   . Fibroid   . Foot pain    bilateral - occ foot pain  . Infertility, female   . Malignant neoplasm of lower-inner quadrant of left female breast ([place]) 03/16/2016   left breast DCIS  . Obesity     Past Surgical History:  Procedure  Laterality Date  . ABDOMINAL HYSTERECTOMY    . BREAST BIOPSY Left    benign  . BREAST LUMPECTOMY WITH RADIOACTIVE SEED AND SENTINEL LYMPH NODE BIOPSY Left 04/15/2016   Procedure: BREAST LUMPECTOMY WITH RADIOACTIVE SEED AND SENTINEL LYMPH NODE BIOPSY;  Surgeon: Bethany Messing III, MD;  Location: [facility];  Service: General;  Laterality: Left;  BREAST LUMPECTOMY WITH RADIOACTIVE SEED AND SENTINEL LYMPH NODE BIOPSY  . DILATION AND CURETTAGE OF UTERUS    . laproscopy     three  . TONSILLECTOMY      Social History   Social History  . Marital status: Married    Spouse name: N/A  . Number of children: N/A  . Years of education: N/A   Social History Main Topics  . Smoking status: Never Smoker  . Smokeless tobacco: Never Used  . Alcohol use 4.2 oz/week    4 Glasses of wine, 3 Standard drinks or equivalent per week     Comment: occasionally  . Drug use: No  . Sexual activity: Yes    Partners: Male    Birth control/ protection: Surgical     Comment: Hysterectomy   Other Topics Concern  . Not on file   Social History Narrative   Work or School: homemaker      Home Situation: lives with husband and son in [place] (also has a daughter)      Spiritual Beliefs:  Darrick Meigs  Lifestyle: restarting a healthy diet and exercise in 2016           FAMILY HISTORY:  We obtained a detailed, 4-generation family history.  Significant diagnoses are listed below: Family History  Problem Relation Age of Onset  . Heart disease Father   . Heart attack Father 74  . Transient ischemic attack Mother   . Stroke Mother   . Breast cancer Maternal Grandmother 60  . Melanoma Sister 28  . Scleroderma Paternal Uncle 42  . Heart attack Paternal Grandfather 60  . Colon cancer Neg Hx   . Colon polyps Neg Hx   . Esophageal cancer Neg Hx   . Rectal cancer Neg Hx   . Stomach cancer Neg Hx    Bethany Gardner has one biological daughter, age 2, without cancers or tumors. She also has an  adopted son, age 72. Bethany Gardner has 2 sisters and one brother. One sister had melanoma at 50 and is currently 94. The other sister is 25 without cancers; this sister underwent genetic testing for hereditary cancers syndromes through [facility] 28-gene panel in January 2018. She tested negative for mutations in all genes analyzed. However, [facility]'s combination of SNP analysis and Tyrer-Cuzick indicated that this sister is at high-risk for breast cancer. This sister's result report was provided by Ms. Presas via a picture on her phone. Bethany Gardner's brother is 54 without cancers.  Bethany Gardner mother is 53 without cancers. Her mother has 2 brothers. One brother is 33 and the other died around age 34 of unknown cause. Neither brother has had cancer. Bethany Gardner maternal grandmother had breast cancer at 62 and died at an unknown age. This grandmother had 2 sisters who also had breast cancer. Bethany Gardner maternal grandfather died at 77 without cancers.  Bethany Gardner father is 43 without cancers. He had one brother who died at 33 from scleroderma. Bethany Gardner paternal grandmother died in her 23s and her paternal grandfather died at 85 from a heart attack. Neither grandparent had cancers.  Bethany Gardner is unaware of previous family history of genetic testing for hereditary cancer risks aside from her sister's. Patient's maternal ancestors are of [place] descent, and paternal ancestors are of [place] descent. There is no reported Ashkenazi Jewish ancestry. There is no known consanguinity.  GENETIC COUNSELING ASSESSMENT: Bethany Gardner is a 56 y.o. female with a personal and family history which is somewhat suggestive of a hereditary cancer syndrome and predisposition to cancer. We, therefore, discussed and recommended the following at today's visit.   DISCUSSION: We reviewed the characteristics, features and inheritance patterns of hereditary cancer syndromes. We also discussed genetic testing,  including the appropriate family members to test, the process of testing, insurance coverage and turn-around-time for results. We discussed the implications of a negative, positive and/or variant of uncertain significant result. We recommended Ms. Brissett pursue genetic testing for the 46-gene Common Hereditary Cancer Panel offered by [facility].   Based on Ms. Marucci's personal and family history of cancer, she meets medical criteria for genetic testing. Despite that she meets criteria, she may still have an out of pocket cost. We discussed that if her out of pocket cost for testing is over $100, the laboratory will call and confirm whether she wants to proceed with testing.  If the out of pocket cost of testing is less than $100 she will be billed by the genetic testing laboratory.   PLAN: After considering the risks, benefits, and limitations, Bethany Gardner did not  wish to pursue genetic testing at today's visit. She stated that she currently has many other life/family stressors and is not sure that now is a good time for her to pursue this test as it has potential to add to her stress. We understand this decision, and remain available to coordinate genetic testing at any time in the future. We, therefore, recommend Bethany Gardner continue to follow the cancer screening guidelines given by her primary healthcare provider.  Lastly, we encouraged Bethany Gardner to remain in contact with cancer genetics annually so that we can continuously update the family history and inform her of any changes in cancer genetics and testing that may be of benefit for this family.   Ms.  Gardner questions were answered to her satisfaction today. Our contact information was provided should additional questions or concerns arise. Thank you for the referral and allowing [place] to share in the care of your patient.   Mal Misty, MS, [facility] Certified Naval [email or web address] phone: [phone]  The patient was seen  for a total of 45 minutes in face-to-face genetic counseling.   _______________________________________________________________________ For Office Staff:  Number of people involved in session: 1 Was an Intern/ student involved with case: no

## 2016-07-21 ENCOUNTER — Ambulatory Visit (INDEPENDENT_AMBULATORY_CARE_PROVIDER_SITE_OTHER): Payer: Managed Care, Other (non HMO) | Admitting: Obstetrics and Gynecology

## 2016-07-21 ENCOUNTER — Encounter: Payer: Self-pay | Admitting: Obstetrics and Gynecology

## 2016-07-21 VITALS — BP 110/70 | HR 84 | Resp 18 | Ht 65.25 in

## 2016-07-21 DIAGNOSIS — E041 Nontoxic single thyroid nodule: Secondary | ICD-10-CM | POA: Diagnosis not present

## 2016-07-21 DIAGNOSIS — Z01419 Encounter for gynecological examination (general) (routine) without abnormal findings: Secondary | ICD-10-CM | POA: Diagnosis not present

## 2016-07-21 DIAGNOSIS — Z853 Personal history of malignant neoplasm of breast: Secondary | ICD-10-CM | POA: Diagnosis not present

## 2016-07-21 DIAGNOSIS — Z0000 Encounter for general adult medical examination without abnormal findings: Secondary | ICD-10-CM | POA: Diagnosis not present

## 2016-07-21 NOTE — Progress Notes (Signed)
56 y.o. [id number] MarriedCaucasianF here for annual exam.  H/O TLH She was diagnosed with DCIS in 12/17, lumpectomy in 1/8, 7 weeks of radiation. No chemo. Currently doing well. She is sexually active, still hurts. Infrequent. Vagina feels irritated and ripping, even with lubricant.  She had a recent CT for a strange sound in her ear. CT noted an 8 mm left thyroid nodule, u/s recommended.  The patient was seen in 1/17 with abdominal pelvic pain. Normal u/s of her ovaries, PMP FSH. She has a h/o constipation. She uses a glycerine suppository every 1-2 days. She just started powdered magnesium. With daily suppositories she is having daily BM's.     No LMP recorded. Patient has had a hysterectomy.          Sexually active: Yes.    The current method of family planning is status post hysterectomy.    Exercising: No.  The patient does not participate in regular exercise at present. Smoker:  no  Health Maintenance: Pap:  Years ago- Hysterectomy  History of abnormal Pap:  no MMG:  05-04-16 - BX- Breast cancer- just finished radiation  Colonoscopy:  02-05-15  BMD:   Never TDaP:  2016 Gardasil: N/A   reports that she has never smoked. She has never used smokeless tobacco. She reports that she does not drink alcohol or use drugs. She is a Agricultural engineer, and Training and development officer, h/o Community education officer. Son graduated from [employer] last year, no college. Daughter had a baby girl, moved here last year.   Past Medical History:  Diagnosis Date  . Anemia    father has Factor V Leiden-no issues with it. Pt has not been tested  . Endometriosis   . Fibroid   . Foot pain    bilateral - occ foot pain  . Infertility, female   . Malignant neoplasm of lower-inner quadrant of left female breast ([place]) 03/16/2016   left breast DCIS  . Obesity     Past Surgical History:  Procedure Laterality Date  . ABDOMINAL HYSTERECTOMY    . BREAST BIOPSY Left    benign  . BREAST LUMPECTOMY WITH RADIOACTIVE SEED AND SENTINEL LYMPH NODE BIOPSY  Left 04/15/2016   Procedure: BREAST LUMPECTOMY WITH RADIOACTIVE SEED AND SENTINEL LYMPH NODE BIOPSY;  Surgeon: Autumn Messing III, MD;  Location: [facility];  Service: General;  Laterality: Left;  BREAST LUMPECTOMY WITH RADIOACTIVE SEED AND SENTINEL LYMPH NODE BIOPSY  . DILATION AND CURETTAGE OF UTERUS    . laproscopy     three  . TONSILLECTOMY      Current Outpatient Prescriptions  Medication Sig Dispense Refill  . non-metallic deodorant (ALRA) MISC Apply 1 application topically daily as needed.    . Nutritional Supplements (JUICE PLUS FIBRE PO) Take by mouth.    . tamoxifen (NOLVADEX) 20 MG tablet Take 1 tablet (20 mg total) by mouth daily. 30 tablet 12  . TURMERIC PO Take by mouth.    Marland Kitchen VALERIAN ROOT PO Take by mouth.     No current facility-administered medications for this visit.    Facility-Administered Medications Ordered in Other Visits  Medication Dose Route Frequency Provider Last Rate Last Dose  . dextrose 5 % and 0.9 % NaCl with KCl 20 mEq/L infusion   Intravenous Continuous Autumn Messing III, MD      . heparin injection 5,000 Units  5,000 Units Subcutaneous Q8H Autumn Messing III, MD      . HYDROcodone-acetaminophen (NORCO/VICODIN) 5-325 MG per tablet 1-2 tablet  1-2 tablet Oral  Q4H PRN Autumn Messing III, MD      . ondansetron (ZOFRAN-ODT) disintegrating tablet 4 mg  4 mg Oral Q6H PRN Autumn Messing III, MD       Or  . ondansetron (ZOFRAN) 4 mg in sodium chloride 0.9 % 50 mL IVPB  4 mg Intravenous Q6H PRN Autumn Messing III, MD      . pantoprazole (PROTONIX) injection 40 mg  40 mg Intravenous QHS Autumn Messing III, MD        Family History  Problem Relation Age of Onset  . Heart disease Father   . Heart attack Father 84  . Transient ischemic attack Mother   . Stroke Mother   . Breast cancer Maternal Grandmother 60  . Melanoma Sister 63  . Scleroderma Paternal Uncle 20  . Heart attack Paternal Grandfather 45  . Colon cancer Neg Hx   . Colon polyps Neg Hx   . Esophageal cancer Neg Hx    . Rectal cancer Neg Hx   . Stomach cancer Neg Hx     Review of Systems  Constitutional: Negative.   HENT:       "ringing- whooshing" sound in ear  Eyes: Negative.   Respiratory: Negative.   Cardiovascular: Negative.   Gastrointestinal: Negative.   Endocrine: Negative.   Genitourinary: Negative.   Musculoskeletal: Negative.   Skin: Negative.   Allergic/Immunologic: Negative.   Neurological: Negative.   Psychiatric/Behavioral: Negative.     Exam:   BP 110/70 (BP Location: Right Arm, Patient Position: Sitting, Cuff Size: Normal)   Pulse 84   Resp 18   Ht 5' 5.25" (1.657 m)   Weight change: @WEIGHTCHANGE @ Height:   Height: 5' 5.25" (165.7 cm)  Ht Readings from Last 3 Encounters:  07/21/16 5' 5.25" (1.657 m)  06/21/16 5' 6"  (1.676 m)  05/12/16 5' 6"  (1.676 m)    General appearance: alert, cooperative and appears stated age Head: Normocephalic, without obvious abnormality, atraumatic Neck: no adenopathy, supple, symmetrical, trachea midline and thyroid normal to inspection and palpation Lungs: clear to auscultation bilaterally Cardiovascular: regular rate and rhythm Breasts: normal appearance, no masses or tenderness, only examined the right breast, the left breast is too tender s/p radiation Abdomen: soft, non-tender; bowel sounds normal; no masses,  no organomegaly Extremities: extremities normal, atraumatic, no cyanosis or edema Skin: Skin color, texture, turgor normal. No rashes or lesions Lymph nodes: Cervical, supraclavicular, and axillary nodes normal. No abnormal inguinal nodes palpated Neurologic: Grossly normal   Pelvic: External genitalia:  no lesions              Urethra:  normal appearing urethra with no masses, tenderness or lesions              Bartholins and Skenes: normal                 Vagina: normal appearing vagina with mild atrophy              Cervix: absent               Bimanual Exam:  Uterus:  uterus absent              Adnexa: no mass,  fullness, tenderness               Rectovaginal: Confirms               Anus:  normal sphincter tone, no lesions  Chaperone was present for exam.  A:  Well Woman with normal  exam  Dyspareunia, vaginal atrophy, can't take vaginal estrogen  H/O Breast cancer, considering BRCA testing  P:   No pap needed  F/U with Oncology for breast cancer  Mammogram in 11/18  Colonoscopy UTD  Return for fasting lipid panel, rest of her labs are UTD  TFT's  Thyroid ultrasound  Discussed vaginal lubrication (she could consider the monalisa, not fda approved)

## 2016-07-21 NOTE — Patient Instructions (Signed)
Try 500 mg a day of Magnesium for constipation  EXERCISE AND DIET:  We recommended that you start or continue a regular exercise program for good health. Regular exercise means any activity that makes your heart beat faster and makes you sweat.  We recommend exercising at least 30 minutes per day at least 3 days a week, preferably 4 or 5.  We also recommend a diet low in fat and sugar.  Inactivity, poor dietary choices and obesity can cause diabetes, heart attack, stroke, and kidney damage, among others.    ALCOHOL AND SMOKING:  Women should limit their alcohol intake to no more than 7 drinks/beers/glasses of wine (combined, not each!) per week. Moderation of alcohol intake to this level decreases your risk of breast cancer and liver damage. And of course, no recreational drugs are part of a healthy lifestyle.  And absolutely no smoking or even second hand smoke. Most people know smoking can cause heart and lung diseases, but did you know it also contributes to weakening of your bones? Aging of your skin?  Yellowing of your teeth and nails?  CALCIUM AND VITAMIN D:  Adequate intake of calcium and Vitamin D are recommended.  The recommendations for exact amounts of these supplements seem to change often, but generally speaking 600 mg of calcium (either carbonate or citrate) and 800 units of Vitamin D per day seems prudent. Certain women may benefit from higher intake of Vitamin D.  If you are among these women, your doctor will have told you during your visit.    PAP SMEARS:  Pap smears, to check for cervical cancer or precancers,  have traditionally been done yearly, although recent scientific advances have shown that most women can have pap smears less often.  However, every woman still should have a physical exam from her gynecologist every year. It will include a breast check, inspection of the vulva and vagina to check for abnormal growths or skin changes, a visual exam of the cervix, and then an exam to  evaluate the size and shape of the uterus and ovaries.  And after 55 years of age, a rectal exam is indicated to check for rectal cancers. We will also provide age appropriate advice regarding health maintenance, like when you should have certain vaccines, screening for sexually transmitted diseases, bone density testing, colonoscopy, mammograms, etc.   MAMMOGRAMS:  All women over 51 years old should have a yearly mammogram. Many facilities now offer a "3D" mammogram, which may cost around $50 extra out of pocket. If possible,  we recommend you accept the option to have the 3D mammogram performed.  It both reduces the number of women who will be called back for extra views which then turn out to be normal, and it is better than the routine mammogram at detecting truly abnormal areas.    COLONOSCOPY:  Colonoscopy to screen for colon cancer is recommended for all women at age 55.  We know, you hate the idea of the prep.  We agree, BUT, having colon cancer and not knowing it is worse!!  Colon cancer so often starts as a polyp that can be seen and removed at colonscopy, which can quite literally save your life!  And if your first colonoscopy is normal and you have no family history of colon cancer, most women don't have to have it again for 10 years.  Once every ten years, you can do something that may end up saving your life, right?  We will be happy  to help you get it scheduled when you are ready.  Be sure to check your insurance coverage so you understand how much it will cost.  It may be covered as a preventative service at no cost, but you should check your particular policy.

## 2016-07-26 ENCOUNTER — Telehealth: Payer: Self-pay | Admitting: Obstetrics and Gynecology

## 2016-07-26 ENCOUNTER — Telehealth: Payer: Self-pay

## 2016-07-26 ENCOUNTER — Other Ambulatory Visit: Payer: Managed Care, Other (non HMO)

## 2016-07-26 NOTE — Telephone Encounter (Signed)
Patient says she is waiting on a call to have a thyroid ultrasound.

## 2016-07-26 NOTE — Telephone Encounter (Signed)
Deloris Ping, Referral for thyroid US shows appointment pending as of 07/21/16 for [place] can you advise on scheduling?   CC: Dr. Talbert Nan

## 2016-07-26 NOTE — Telephone Encounter (Signed)
Called [facility] to check the status of requested head/neck ultrasound.  I spoke with Alaina, who acknowledged the receipt of the order and states she will call the patient now for scheduling. Routing to provider to advise.  Routing to Dr Talbert Nan  cc: Glorianne [place]

## 2016-07-26 NOTE — Telephone Encounter (Signed)
Patient was sick last night and will call back to reschedule lab appointment.

## 2016-07-26 NOTE — Telephone Encounter (Signed)
Pt called to inquire what the survivorship care plan visit is about. Discussed with pt.

## 2016-07-28 NOTE — Telephone Encounter (Signed)
Called [facility] to check the status of referral for head/neck ultrasound. I spoke with Tye Maryland in their office, she advised they tried to contact the patient to schedule, but there was no answer and the patients voicemail box was full.  I made a follow up call to the patient to convey this information. I have provided to the patient the phone number for [facility], patient states she will call to schedule.    Patient also rescheduled missed lab appointment from 07/26/16.  Patient is scheduled for lab appointment on 07/29/16.  Forwarding to Dr Talbert Nan for final review

## 2016-07-28 NOTE — Telephone Encounter (Signed)
Per review of EPIC, patient is scheduled at [facility] for US soft tissue head & neck on 08/02/16 at 11am.  Routing to provider for final review. Will close encounter.  Cc: Lerry Liner

## 2016-07-29 ENCOUNTER — Other Ambulatory Visit: Payer: Managed Care, Other (non HMO)

## 2016-07-29 ENCOUNTER — Telehealth: Payer: Self-pay | Admitting: Obstetrics and Gynecology

## 2016-07-29 NOTE — Telephone Encounter (Signed)
Called patient to reschedule missed lab appointment but mailbox is full.

## 2016-08-02 ENCOUNTER — Other Ambulatory Visit: Payer: Managed Care, Other (non HMO)

## 2016-08-05 ENCOUNTER — Ambulatory Visit
Admission: RE | Admit: 2016-08-05 | Discharge: 2016-08-05 | Disposition: A | Discharge status: Home / Self Care | Payer: Managed Care, Other (non HMO) | Source: Ambulatory Visit | Attending: Obstetrics and Gynecology | Admitting: Obstetrics and Gynecology

## 2016-08-05 DIAGNOSIS — E041 Nontoxic single thyroid nodule: Secondary | ICD-10-CM

## 2016-08-08 ENCOUNTER — Telehealth: Payer: Self-pay

## 2016-08-08 ENCOUNTER — Other Ambulatory Visit: Payer: Self-pay | Admitting: *Deleted

## 2016-08-08 DIAGNOSIS — Z170 Estrogen receptor positive status [ER+]: Principal | ICD-10-CM

## 2016-08-08 DIAGNOSIS — R9389 Abnormal findings on diagnostic imaging of other specified body structures: Secondary | ICD-10-CM

## 2016-08-08 DIAGNOSIS — C50312 Malignant neoplasm of lower-inner quadrant of left female breast: Secondary | ICD-10-CM

## 2016-08-08 NOTE — Telephone Encounter (Signed)
Bethany Gardner with [facility] called to request that US guided needle placement order be changed to US thyroid biopsy. Order corrected.

## 2016-08-10 ENCOUNTER — Ambulatory Visit
Admission: RE | Admit: 2016-08-10 | Discharge: 2016-08-10 | Disposition: A | Discharge status: Home / Self Care | Payer: Managed Care, Other (non HMO) | Source: Ambulatory Visit | Attending: Oncology | Admitting: Oncology

## 2016-08-10 ENCOUNTER — Other Ambulatory Visit (HOSPITAL_COMMUNITY)
Admission: RE | Admit: 2016-08-10 | Discharge: 2016-08-10 | Disposition: A | Discharge status: Home / Self Care | Payer: Managed Care, Other (non HMO) | Source: Ambulatory Visit | Attending: Radiology | Admitting: Radiology

## 2016-08-10 DIAGNOSIS — Z853 Personal history of malignant neoplasm of breast: Secondary | ICD-10-CM | POA: Diagnosis not present

## 2016-08-10 DIAGNOSIS — R9389 Abnormal findings on diagnostic imaging of other specified body structures: Secondary | ICD-10-CM

## 2016-08-10 DIAGNOSIS — E041 Nontoxic single thyroid nodule: Secondary | ICD-10-CM | POA: Insufficient documentation

## 2016-08-10 DIAGNOSIS — C50312 Malignant neoplasm of lower-inner quadrant of left female breast: Secondary | ICD-10-CM

## 2016-08-16 ENCOUNTER — Telehealth: Payer: Self-pay | Admitting: *Deleted

## 2016-08-16 ENCOUNTER — Other Ambulatory Visit: Payer: Self-pay | Admitting: Oncology

## 2016-08-16 ENCOUNTER — Telehealth: Payer: Self-pay

## 2016-08-16 NOTE — Telephone Encounter (Signed)
"  I had a biopsy of my thyroid last week.  Was told it takes two days to result.  Called yesterday.  Would like a call [phone]) with some information.  Go a little bit further, is it on his desk, is it lost, why is it taking so long?"

## 2016-08-16 NOTE — Telephone Encounter (Signed)
LVM with pt with results of thyroid bx per Dr Jana Hakim.

## 2016-08-17 ENCOUNTER — Encounter: Payer: Self-pay | Admitting: Radiation Oncology

## 2016-08-17 NOTE — Progress Notes (Signed)
  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 Erythema noted in the treatment area with moist desquamation in the inframammary fold with associated burning.  Plan: The patient has completed radiation treatment. The patient will return to radiation oncology clinic for routine followup in one month. I advised them to call or return sooner if they have any questions or concerns related to their recovery or treatment.  ------------------------------------------------  Jodelle Gross, MD, PhD  This document serves as a record of services personally performed by Kyung Rudd, MD. It was created on his behalf by Bethann Humble, a trained medical scribe. The creation of this record is based on the scribe's personal observations and the provider's statements to them. This document has been checked and approved by the attending provider.

## 2016-08-17 NOTE — Telephone Encounter (Signed)
LVM with results per Dr Jana Hakim

## 2016-08-18 NOTE — Progress Notes (Signed)
Bethany Gardner 56 y.o. woman with Stage 0 (TisN0M0) DCIS of the LIQ of Left Breast, ER/PR Positive radiation completed 07-08-16, one month FU.   Skin status:Left breast with normal color.   What lotion are you using? She in not using any lotion with vitamin E to her left breast. Have you seen med onc? If not, when is appointment:06-21-16 Dr. Jana Hakim  We also discussed the mechanics of proceeding through genetics testing and what she is likely to learn from those tests.   will return to see me in August. She will have a mammography before that visit. She knows to call for any other problems that may develop before then. Accordingly she will return to see me in August, 21th. She will have a mammography before that visit. She knows to call for any other problems that may develop before then If they are ER+, have they started Al or Tamoxifen? If not, why? 07-04-16 Tamoxifen Discuss survivorship appointment:10-21-16 Wilber Bihari, N.P. Have you had a mammogram scheduled? Not scheduled yet Offer referral to [facility]. Will receive at the survivorship appointment. 10-21-16 Appetite:Good eating three meals daily. Pain:No, Reports getting a deep ache to her left breast at times. Fatigue:Denies fatigue "says she is back to normal". Arm mobility:Denies difficulty lifting left arm. Lymphedema:None: 08-10-16 [place] thyroiy biopsy, 08-05-16 US soft tissue head and neck Wt Readings from Last 3 Encounters:  05/12/16 188 lb 3.2 oz (85.4 kg)  05/04/16 199 lb 11.8 oz (90.6 kg)  04/15/16 199 lb (90.3 kg)  BP 118/68   Pulse 76   Temp 98.3 F (36.8 C) (Oral)   Resp 18   Ht 5' 5.25" (1.657 m)

## 2016-08-24 ENCOUNTER — Encounter: Payer: Self-pay | Admitting: Radiation Oncology

## 2016-08-24 ENCOUNTER — Ambulatory Visit
Admission: RE | Admit: 2016-08-24 | Discharge: 2016-08-24 | Disposition: A | Discharge status: Home / Self Care | Payer: Managed Care, Other (non HMO) | Source: Ambulatory Visit | Attending: Radiation Oncology | Admitting: Radiation Oncology

## 2016-08-24 VITALS — BP 118/68 | HR 76 | Temp 98.3°F | Resp 18 | Ht 65.25 in

## 2016-08-24 DIAGNOSIS — Z803 Family history of malignant neoplasm of breast: Secondary | ICD-10-CM | POA: Diagnosis not present

## 2016-08-24 DIAGNOSIS — Z170 Estrogen receptor positive status [ER+]: Secondary | ICD-10-CM | POA: Insufficient documentation

## 2016-08-24 DIAGNOSIS — Z8249 Family history of ischemic heart disease and other diseases of the circulatory system: Secondary | ICD-10-CM | POA: Insufficient documentation

## 2016-08-24 DIAGNOSIS — Z808 Family history of malignant neoplasm of other organs or systems: Secondary | ICD-10-CM | POA: Insufficient documentation

## 2016-08-24 DIAGNOSIS — Z823 Family history of stroke: Secondary | ICD-10-CM | POA: Insufficient documentation

## 2016-08-24 DIAGNOSIS — E669 Obesity, unspecified: Secondary | ICD-10-CM | POA: Diagnosis not present

## 2016-08-24 DIAGNOSIS — C50312 Malignant neoplasm of lower-inner quadrant of left female breast: Secondary | ICD-10-CM | POA: Insufficient documentation

## 2016-08-24 DIAGNOSIS — Z6830 Body mass index (BMI) 30.0-30.9, adult: Secondary | ICD-10-CM | POA: Insufficient documentation

## 2016-08-24 DIAGNOSIS — Z510 Encounter for antineoplastic radiation therapy: Secondary | ICD-10-CM | POA: Insufficient documentation

## 2016-08-24 NOTE — Progress Notes (Addendum)
Radiation Oncology         [phone] ________________________________  Name: Bethany Gardner MRN: [id number]  Date: 08/24/2016  DOB: 25-May-1960  Post Treatment Note  CC: Lucretia Kern, DO  Magrinat, Virgie Dad, MD  Diagnosis:   ER/PR positive DCIS of the left breast.   Interval Since Last Radiation:  6 weeks    05/23/16-07/08/16 1. Left breast/ 50.4 Gy in 28 fractions 2. Left breast boost/ 14 Gy in 7 fractions  Narrative:  The patient returns today for routine follow-up. During treatment she did very well with radiotherapy and did have moderate  desquamation. She has resumed tamoxifen per Dr. Jana Hakim about 2 weeks after completing radiotherpay.                            On review of systems, the patient states she's doing well. She reports that she is having some dryness of skin along the chest wall but otherwise is doing quite well. She denies any questions or concerns regarding her skin otherwise.  ALLERGIES:  has No Known Allergies.  Meds: Current Outpatient Prescriptions  Medication Sig Dispense Refill  . Nutritional Supplements (JUICE PLUS FIBRE PO) Take by mouth.    . tamoxifen (NOLVADEX) 20 MG tablet Take 1 tablet (20 mg total) by mouth daily. 30 tablet 12  . TURMERIC PO Take by mouth.    Marland Kitchen VALERIAN ROOT PO Take by mouth.     No current facility-administered medications for this encounter.    Facility-Administered Medications Ordered in Other Encounters  Medication Dose Route Frequency Provider Last Rate Last Dose  . dextrose 5 % and 0.9 % NaCl with KCl 20 mEq/L infusion   Intravenous Continuous Autumn Messing III, MD      . heparin injection 5,000 Units  5,000 Units Subcutaneous Q8H Autumn Messing III, MD      . HYDROcodone-acetaminophen (NORCO/VICODIN) 5-325 MG per tablet 1-2 tablet  1-2 tablet Oral Q4H PRN Autumn Messing III, MD      . ondansetron (ZOFRAN-ODT) disintegrating tablet 4 mg  4 mg Oral Q6H PRN Autumn Messing III, MD       Or  . ondansetron (ZOFRAN) 4 mg in sodium  chloride 0.9 % 50 mL IVPB  4 mg Intravenous Q6H PRN Autumn Messing III, MD      . pantoprazole (PROTONIX) injection 40 mg  40 mg Intravenous QHS Autumn Messing III, MD        Physical Findings:  height is 5' 5.25" (1.657 m). Her oral temperature is 98.3 F (36.8 C). Her blood pressure is 118/68 and her pulse is 76. Her respiration is 18.  Pain Assessment Pain Score: 0-No pain (Refused weight been on vacation)/10 In general this is a well appearing caucasian female in no acute distress. She's alert and oriented x4 and appropriate throughout the examination. Cardiopulmonary assessment is negative for acute distress and she exhibits normal effort. The leftbreast was examined and reveals minimal dry desquamation along the medial aspect of her breast along the chest wall. No evidence of moist desquamation is present, no evidence of any other area of hyperpigmentation is present.   Lab Findings: Lab Results  Component Value Date   WBC 5.5 05/04/2016   HGB 12.1 05/04/2016   HCT 35.1 (L) 05/04/2016   MCV 91.9 05/04/2016   PLT 267 05/04/2016     Radiographic Findings: US Soft Tissue Head And Neck  Result Date: 08/05/2016 CLINICAL DATA:  Incidental on CT.  Left thyroid nodule. EXAM: THYROID ULTRASOUND TECHNIQUE: Ultrasound examination of the thyroid gland and adjacent soft tissues was performed. COMPARISON:  None. FINDINGS: Parenchymal Echotexture: Moderately heterogenous Isthmus: 0.3 cm Right lobe: 4.7 x 1.3 x 1.4 cm Left lobe: 4.3 x 1.3 x 1.7 cm _________________________________________________________ Estimated total number of nodules >/= 1 cm: 1 Number of spongiform nodules >/=  2 cm not described below (TR1): 0 Number of mixed cystic and solid nodules >/= 1.5 cm not described below (TR2): 0 _________________________________________________________ Nodule # 1: Location: Right; Mid Maximum size: 0.6 cm; Other 2 dimensions: 0.3 x 0.4 cm Composition: solid/almost completely solid (2) Echogenicity: hypoechoic  (2) Shape: not taller-than-wide (0) Margins: lobulated/irregular (2) Echogenic foci: none (0) ACR TI-RADS total points: 6. ACR TI-RADS risk category: TR4 (4-6 points). ACR TI-RADS recommendations: Given size (<0.9 cm) and appearance, this nodule does NOT meet TI-RADS criteria for biopsy or dedicated follow-up. _________________________________________________________ Nodule # 2: Location: Left; Superior Maximum size: 0.8 cm; Other 2 dimensions: 0.6 x 0.5 cm Composition: solid/almost completely solid (2) Echogenicity: hypoechoic (2) Shape: not taller-than-wide (0) Margins: smooth (0) Echogenic foci: none (0) ACR TI-RADS total points: 4. ACR TI-RADS risk category: TR4 (4-6 points). ACR TI-RADS recommendations: Given size (<0.9 cm) and appearance, this nodule does NOT meet TI-RADS criteria for biopsy or dedicated follow-up. _________________________________________________________ Nodule # 3: Location: Left; Mid Maximum size: 1.6 cm; Other 2 dimensions: 1.1 x 0.8 cm Composition: mixed cystic and solid (1) Echogenicity: isoechoic (1) Shape: taller-than-wide (3) Margins: ill-defined (0) Echogenic foci: macrocalcifications (1) ACR TI-RADS total points: 6. ACR TI-RADS risk category: TR4 (4-6 points). ACR TI-RADS recommendations: **Given size (>/= 1.5 cm) and appearance, fine needle aspiration of this moderately suspicious nodule should be considered based on TI-RADS criteria. _________________________________________________________ Nodule # 4: Location: Left; Inferior Maximum size: 0.9 cm; Other 2 dimensions: 0.9 x 0.5 cm Composition: solid/almost completely solid (2) Echogenicity: isoechoic (1) Shape: not taller-than-wide (0) Margins: smooth (0) Echogenic foci: none (0) ACR TI-RADS total points: 3. ACR TI-RADS risk category: TR3 (3 points). ACR TI-RADS recommendations: Given size (<1.4 cm) and appearance, this nodule does NOT meet TI-RADS criteria for biopsy or dedicated follow-up.  _________________________________________________________ IMPRESSION: Multiple bilateral nodules are noted. Nodule 3 in the left lobe meets criteria for fine needle aspiration biopsy. It corresponds to the abnormality on CT. The above is in keeping with the ACR TI-RADS recommendations - J Am Coll Radiol 2017;14:587-595. Electronically Signed   By: Marybelle Killings M.D.   On: 08/05/2016 15:05   US Thyroid Biopsy  Result Date: 08/10/2016 INDICATION: Patient with history of left breast DCIS as well as incidental thyroid nodule noted on recent CT. Follow-up thyroid ultrasound on 08/05/2016 revealed bilateral thyroid nodules with a dominant left mid lobe nodule measuring 1.6 cm. Request now received for needle aspirate biopsy of this dominant left thyroid nodule. EXAM: ULTRASOUND GUIDED FINE NEEDLE ASPIRATION BIOPSY OF DOMINANT LEFT MID THYROID LOBE NODULE COMPARISON:  Thyroid ultrasound dated 08/05/2016 MEDICATIONS: None COMPLICATIONS: None immediate. TECHNIQUE: Informed written consent was obtained from the patient after a discussion of the risks, benefits and alternatives to treatment. Questions regarding the procedure were encouraged and answered. A timeout was performed prior to the initiation of the procedure. Pre-procedural ultrasound scanning demonstrated unchanged size and appearance of the indeterminate nodule within the left thyroid lobe. The procedure was planned. The neck was prepped in the usual sterile fashion, and a sterile drape was applied covering the operative field. A timeout was performed prior to the initiation of the  procedure. Local anesthesia was provided with 1% lidocaine. Under direct ultrasound guidance, 4 FNA biopsies were performed of the dominant left mid thyroid lobe nodule with 25 gauge needles. Multiple ultrasound images were saved for procedural documentation purposes. The samples were prepared and submitted to pathology. Limited post procedural scanning was negative for hematoma or  additional complication. Dressings were placed. The patient tolerated the above procedures procedure well without immediate postprocedural complication. FINDINGS: FINDINGS Nodule reference number based on prior diagnostic ultrasound: 3 Maximum size:  1.6 cm Location: Left; Mid ACR TI-RADS risk category: TR4 (4-6 points) Reason for biopsy: meets ACR TI-RADS criteria Ultrasound imaging confirms appropriate placement of the needles within the thyroid nodule. IMPRESSION: Technically successful ultrasound guided fine needle aspiration biopsy of dominant left mid thyroid lobe nodule. Final pathology pending. Read by: Rowe Robert, PA-C Electronically Signed   By: Markus Daft M.D.   On: 08/10/2016 15:55    Impression/Plan: 1. ER/PR positive DCIS of the left breast. The patient has been doing well since completion of radiotherapy. We discussed that we would be happy to continue to follow her as needed, but she will also continue to follow up with Dr. Jana Hakim in medical oncology, who will continue management of her tamoxifen. She was counseled on skin care as well as measures to avoid sun exposure to this area.  2. Survivorship. The concept of survivorship was discussed with the patient, and she has an appointment for this in July. I've given her information for live strong, as well as resources encounter form that are occurring at the cancer center. 3. Possible genetic predisposition to malignancy. The patient is not with genetics, however this point in time is not interested in moving forward in the immediate future. She will revisit this discussion with Dr. Jana Hakim and decide if she wants testing in the future.    Carola Rhine, PAC

## 2016-08-24 NOTE — Addendum Note (Signed)
Encounter addended by: Malena Edman, RN on: 08/24/2016  2:27 PM<BR>    Actions taken: Charge Capture section accepted

## 2016-08-26 ENCOUNTER — Telehealth: Payer: Self-pay | Admitting: *Deleted

## 2016-08-26 NOTE — Telephone Encounter (Signed)
This RN returned call per VM from pt stating she has returned from vacation and wanted to discuss negative biopsy of her thyroid. " just wanted to know what is done now if anything "  Return call number given as [phone].  Obtained identified VM- message left to return call to this RN next week.

## 2016-08-30 ENCOUNTER — Other Ambulatory Visit: Payer: Managed Care, Other (non HMO)

## 2016-08-30 DIAGNOSIS — E041 Nontoxic single thyroid nodule: Secondary | ICD-10-CM

## 2016-08-30 DIAGNOSIS — Z0000 Encounter for general adult medical examination without abnormal findings: Secondary | ICD-10-CM

## 2016-08-30 LAB — LIPID PANEL
Cholesterol: 231 mg/dL — ABNORMAL HIGH (ref <200)
HDL: 60 mg/dL (ref >50)
LDL Cholesterol: 154 mg/dL — ABNORMAL HIGH (ref <100)
TRIGLYCERIDES: 84 mg/dL (ref <150)
Total CHOL/HDL Ratio: 3.9 Ratio (ref <5.0)
VLDL: 17 mg/dL (ref <30)

## 2016-08-30 NOTE — Addendum Note (Signed)
Addended by: Nicholes Rough on: 08/30/2016 09:13 AM   Modules accepted: Orders

## 2016-08-31 LAB — THYROID PANEL WITH TSH
FREE THYROXINE INDEX: 2.7 (ref 1.4–3.8)
T3 Uptake: 30 % (ref 22–35)
T4, Total: 9.1 ug/dL (ref 4.5–12.0)
TSH: 2.58 m[IU]/L

## 2016-09-08 ENCOUNTER — Telehealth: Payer: Self-pay

## 2016-09-08 DIAGNOSIS — R9389 Abnormal findings on diagnostic imaging of other specified body structures: Secondary | ICD-10-CM

## 2016-09-08 DIAGNOSIS — C50312 Malignant neoplasm of lower-inner quadrant of left female breast: Secondary | ICD-10-CM

## 2016-09-08 NOTE — Telephone Encounter (Signed)
Pt called asking where her mammogram stands. The order is in for her mammogram and she can call the breast center to schedule it.   She was asking about a referral to endocrinologist. S/w Dr Jana Hakim and he is OK with referral to Dr Buddy Duty. She wants to see endocrinologist for the 4 growths on thyroid, bx was negative. She wants endocrinologist to follow her with this.   Called Dr Cindra Eves office and received fax number.

## 2016-09-09 NOTE — Telephone Encounter (Signed)
lvm that referral paperwork has been faxed to Dr Buddy Duty. Dr Buddy Duty will be the person in charge of when her appt will be at his office. Probably in a month or 2, but again Dr Buddy Duty is in charge of that. Almyra Free, the nurse who does intake for Dr Buddy Duty is aware of the situation.

## 2016-09-12 ENCOUNTER — Telehealth: Payer: Self-pay

## 2016-09-12 NOTE — Telephone Encounter (Signed)
Pt called that she s/w Val and Juliann Pulse in the last 2 weeks about a mammogram being scheduled. A new normal or baseline. A mammogram order is in chart for August. She is actually scheduled for November.  This RN called [facility] and Nov. is the next appt when her insurance will pay for the MM.  She is having a deep achey pain in her breasts at times. Some times it is stabbing. She is not sure if this is from the radiation.   She does have survivorship appt 7/13 - offered to have that moved sooner so she can have her questions answered, but she declined.

## 2016-09-15 ENCOUNTER — Encounter: Payer: Self-pay | Admitting: Obstetrics and Gynecology

## 2016-09-15 ENCOUNTER — Telehealth: Payer: Self-pay | Admitting: Obstetrics and Gynecology

## 2016-09-15 ENCOUNTER — Ambulatory Visit (INDEPENDENT_AMBULATORY_CARE_PROVIDER_SITE_OTHER): Payer: Managed Care, Other (non HMO) | Admitting: Obstetrics and Gynecology

## 2016-09-15 VITALS — BP 108/60 | HR 16 | Resp 16

## 2016-09-15 DIAGNOSIS — E663 Overweight: Secondary | ICD-10-CM

## 2016-09-15 DIAGNOSIS — K602 Anal fissure, unspecified: Secondary | ICD-10-CM

## 2016-09-15 DIAGNOSIS — K625 Hemorrhage of anus and rectum: Secondary | ICD-10-CM | POA: Diagnosis not present

## 2016-09-15 DIAGNOSIS — E7800 Pure hypercholesterolemia, unspecified: Secondary | ICD-10-CM | POA: Diagnosis not present

## 2016-09-15 DIAGNOSIS — E042 Nontoxic multinodular goiter: Secondary | ICD-10-CM | POA: Diagnosis not present

## 2016-09-15 NOTE — Telephone Encounter (Signed)
Spoke with patient. Patient reports for the past few days has been experiencing bright red bleeding with BM. Reports large BM prior to bleeding starting. States no history of hemorrhoids. Patient asking when last colonoscopy was and where? Advised per review of EPIC, 02/05/15 [facility]. Recommended OV for further evaluation, patient scheduled for today at 1pm with Dr. Talbert Nan.   Routing to provider for final review. Patient is agreeable to disposition. Will close encounter.

## 2016-09-15 NOTE — Progress Notes (Signed)
GYNECOLOGY  VISIT   HPI: 56 y.o.   Married  Caucasian  female   [id number] with No LMP recorded. Patient has had a hysterectomy.   here for bright red bleeding with BM for 4-5 days. The first day that she noticed blood, she had a very large BM, needed to strain. Initially it was a small amount of blood, today she had a lot of blood. No straining this morning with BM, normal consistence. Not sure if there was blood in the stool, definitely on top of it. No pain with BM other than the first day she noted blood. No black tarry stools.  She typically needs to use a suppository to have a BM. Currently on a keto diet, trying to loose weight. Interested to see a nutritionist. She had concerns about her recent lipid panel.  She had her last colonoscopy in 10/16. She recently was diagnosed with bilateral thyroid nodules, had a scant but benign biopsy. She requests f/u with Endocrinology (Dr Buddy Duty).   GYNECOLOGIC HISTORY: No LMP recorded. Patient has had a hysterectomy. Contraception: Hysterectomy Menopausal hormone therapy: none        OB History    Gravida Para Term Preterm AB Living   2 1 1   1 1    SAB TAB Ectopic Multiple Live Births   1       1         Patient Active Problem List   Diagnosis Date Noted  . 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 Malignant neoplasm of lower-inner quadrant of left breast in female, estrogen receptor positive ([facility]) 03/16/2016  . Paresthesia 04/27/2015  . Benign paroxysmal positional vertigo 04/27/2015  . BMI 32.0-32.9,adult 09/10/2014  . Metatarsalgia of both feet 04/29/2014  . Capsulitis 04/29/2014    Past Medical History:  Diagnosis Date  . Anemia    father has Factor V Leiden-no issues with it. Pt has not been tested  . Endometriosis   . Fibroid   . Foot pain    bilateral - occ foot pain  . Infertility, female   . Malignant neoplasm of lower-inner quadrant of left female breast ([facility]) 03/16/2016   left breast DCIS  . Obesity      Past Surgical History:  Procedure Laterality Date  . ABDOMINAL HYSTERECTOMY    . BREAST BIOPSY Left    benign  . BREAST LUMPECTOMY WITH RADIOACTIVE SEED AND SENTINEL LYMPH NODE BIOPSY Left 04/15/2016   Procedure: BREAST LUMPECTOMY WITH RADIOACTIVE SEED AND SENTINEL LYMPH NODE BIOPSY;  Surgeon: Autumn Messing III, MD;  Location: [facility];  Service: General;  Laterality: Left;  BREAST LUMPECTOMY WITH RADIOACTIVE SEED AND SENTINEL LYMPH NODE BIOPSY  . DILATION AND CURETTAGE OF UTERUS    . laproscopy     three  . TONSILLECTOMY      Current Outpatient Prescriptions  Medication Sig Dispense Refill  . Coenzyme Q10 (CO Q 10 PO) Take by mouth daily.    . Nutritional Supplements (JUICE PLUS FIBRE PO) Take by mouth.    . tamoxifen (NOLVADEX) 20 MG tablet Take 1 tablet (20 mg total) by mouth daily. 30 tablet 12  . VALERIAN ROOT PO Take by mouth.     No current facility-administered medications for this visit.    Facility-Administered Medications Ordered in Other Visits  Medication Dose Route Frequency Provider Last Rate Last Dose  . dextrose 5 % and 0.9 % NaCl with KCl 20 mEq/L infusion   Intravenous Continuous Autumn Messing  III, MD      . heparin injection 5,000 Units  5,000 Units Subcutaneous Q8H Autumn Messing III, MD      . HYDROcodone-acetaminophen (NORCO/VICODIN) 5-325 MG per tablet 1-2 tablet  1-2 tablet Oral Q4H PRN Autumn Messing III, MD      . ondansetron (ZOFRAN-ODT) disintegrating tablet 4 mg  4 mg Oral Q6H PRN Autumn Messing III, MD       Or  . ondansetron (ZOFRAN) 4 mg in sodium chloride 0.9 % 50 mL IVPB  4 mg Intravenous Q6H PRN Autumn Messing III, MD      . pantoprazole (PROTONIX) injection 40 mg  40 mg Intravenous QHS Jovita Kussmaul, MD         ALLERGIES: Patient has no known allergies.  Family History  Problem Relation Age of Onset  . Heart disease Father   . Heart attack Father 71  . Transient ischemic attack Mother   . Stroke Mother   . Breast cancer Maternal Grandmother  60  . Melanoma Sister 36  . Scleroderma Paternal Uncle 32  . Heart attack Paternal Grandfather 51  . Colon cancer Neg Hx   . Colon polyps Neg Hx   . Esophageal cancer Neg Hx   . Rectal cancer Neg Hx   . Stomach cancer Neg Hx     Social History   Social History  . Marital status: Married    Spouse name: N/A  . Number of children: N/A  . Years of education: N/A   Occupational History  . Not on file.   Social History Main Topics  . Smoking status: Never Smoker  . Smokeless tobacco: Never Used  . Alcohol use No  . Drug use: No  . Sexual activity: Yes    Partners: Male    Birth control/ protection: Surgical     Comment: Hysterectomy   Other Topics Concern  . Not on file   Social History Narrative   Work or School: homemaker      Home Situation: lives with husband and son in [place] (also has a daughter)      Spiritual Beliefs:  Christian      Lifestyle: restarting a healthy diet and exercise in 2016          Review of Systems  Constitutional: Negative.   HENT: Negative.   Eyes: Negative.   Respiratory: Negative.   Cardiovascular: Negative.   Gastrointestinal: Positive for blood in stool.  Genitourinary: Negative.   Musculoskeletal: Negative.   Skin: Negative.   Neurological: Negative.   Endo/Heme/Allergies: Negative.   Psychiatric/Behavioral: Negative.     PHYSICAL EXAMINATION:    BP 108/60 (BP Location: Right Arm, Patient Position: Sitting, Cuff Size: Normal)   Pulse (!) 16   Resp 16     General appearance: alert, cooperative and appears stated age  Pelvic: External genitalia:  no lesions              Anus: anal fissure noted at 12-1 o'clock  Chaperone was present for exam.  ASSESSMENT Anal fissure   Overweight, she is working on weight loss. She is on the keto diet (high fat, no carbs), has mildly elevated total cholesterol and LDL Recently diagnosed with multiple thyroid nodules, had a benign biopsy. Needs to establish care with  Endocrinology    PLAN Recommendation for metamucil or miralax given. If that doesn't work she can try senna. We discussed that it can take weeks for the fissure to heal. Recommended that she continue a bowel  regimen for 4-6 weeks  She had a normal colonoscopy 1.5 years ago Call with persistent bleeding If the bleeding persists she will need further w/u Will set her up with a nutritionist Will set her up to see Endocrinology    An After Visit Summary was printed and given to the patient.  Over 30 minutes face to face time of which over 50% was spent in counseling.

## 2016-09-15 NOTE — Patient Instructions (Signed)
You can try metamucil or miralax to keep your stools very soft. If not helping, then switch to senna 1-2 tablets 1-2 x a senna.    Anal Fissure, Adult An anal fissure is a small tear or crack in the skin around the anus. Bleeding from a fissure usually stops on its own within a few minutes. However, bleeding will often occur again with each bowel movement until the crack heals. What are the causes? This condition may be caused by:  Passing large, hard stool (feces).  Frequent diarrhea.  Constipation.  Inflammatory bowel disease (Crohn disease or ulcerative colitis).  Infections.  Anal sex.  What are the signs or symptoms? Symptoms of this condition include:  Bleeding from the rectum.  Small amounts of blood seen on your stool, on toilet paper, or in the toilet after a bowel movement.  Painful bowel movements.  Itching or irritation around the anus.  How is this diagnosed? A health care provider may diagnose this condition by closely examining the anal area. An anal fissure can usually be seen with careful inspection. In some cases, a rectal exam may be performed, or a short tube (anoscope) may be used to examine the anal canal. How is this treated? Treatment for this condition may include:  Taking steps to avoid constipation. This may include making changes to your diet, such as increasing your intake of fiber or fluid.  Taking fiber supplements. These supplements can soften your stool to help make bowel movements easier. Your health care provider may also prescribe a stool softener if your stool is often hard.  Taking sitz baths. This may help to heal the tear.  Using medicated creams or ointments. These may be prescribed to lessen discomfort.  Follow these instructions at home: Eating and drinking  Avoid foods that may be constipating, such as bananas and dairy products.  Drink enough fluid to keep your urine clear or pale yellow.  Maintain a diet that is high in  fruits, whole grains, and vegetables. General instructions  Keep the anal area as clean and dry as possible.  Take sitz baths as told by your health care provider. Do not use soap in the sitz baths.  Take over-the-counter and prescription medicines only as told by your health care provider.  Use creams or ointments only as told by your health care provider.  Keep all follow-up visits as told by your health care provider. This is important. Contact a health care provider if:  You have more bleeding.  You have a fever.  You have diarrhea that is mixed with blood.  You continue to have pain.  Your problem is getting worse rather than better. This information is not intended to replace advice given to you by your health care provider. Make sure you discuss any questions you have with your health care provider. Document Released: 03/28/2005 Document Revised: 08/05/2015 Document Reviewed: 06/23/2014 Elsevier Interactive Patient Education  [employer].

## 2016-09-15 NOTE — Telephone Encounter (Signed)
Patient is having bleeding with a bowel movement and would like a referral to a gastroenterologist.

## 2016-10-21 ENCOUNTER — Ambulatory Visit (HOSPITAL_BASED_OUTPATIENT_CLINIC_OR_DEPARTMENT_OTHER): Payer: Managed Care, Other (non HMO) | Admitting: Adult Health

## 2016-10-21 ENCOUNTER — Ambulatory Visit (HOSPITAL_BASED_OUTPATIENT_CLINIC_OR_DEPARTMENT_OTHER): Payer: Managed Care, Other (non HMO)

## 2016-10-21 VITALS — BP 119/70 | HR 69 | Temp 98.2°F | Resp 17 | Ht 65.25 in

## 2016-10-21 DIAGNOSIS — C50312 Malignant neoplasm of lower-inner quadrant of left female breast: Secondary | ICD-10-CM

## 2016-10-21 DIAGNOSIS — Z170 Estrogen receptor positive status [ER+]: Principal | ICD-10-CM

## 2016-10-21 DIAGNOSIS — N951 Menopausal and female climacteric states: Secondary | ICD-10-CM | POA: Diagnosis not present

## 2016-10-21 DIAGNOSIS — D0512 Intraductal carcinoma in situ of left breast: Secondary | ICD-10-CM | POA: Diagnosis not present

## 2016-10-21 DIAGNOSIS — G4700 Insomnia, unspecified: Secondary | ICD-10-CM

## 2016-10-21 NOTE — Progress Notes (Signed)
CLINIC:  Survivorship   REASON FOR VISIT:  Routine follow-up post-treatment for a recent history of breast cancer.  BRIEF ONCOLOGIC HISTORY:    Malignant neoplasm of lower-inner quadrant of left breast in female, estrogen receptor positive ([place])   03/14/2016 Initial Biopsy    Left breast lower inner quadrant biopsy: DCIS, grade 3, ER positive, PR negative      03/16/2016 Initial Diagnosis    Malignant neoplasm of lower-inner quadrant of left breast in female, estrogen receptor positive ([place])     03/2016 -  Anti-estrogen oral therapy    Tamoxifen daily      04/15/2016 Surgery    Left lumpectomy Marlou Starks): high grade DCIS, 4 SLN negative      05/23/2016 - 07/08/2016 Radiation Therapy    1) Left breast/ 50.4 Gy in 28 fractions 2) Left breast boost/ 14 Gy in 7 fractions        INTERVAL HISTORY:  Bethany Gardner presents to the [facility] today for our initial meeting to review her survivorship care plan detailing her treatment course for breast cancer, as well as monitoring long-term side effects of that treatment, education regarding health maintenance, screening, and overall wellness and health promotion.     Overall, Bethany Gardner  si doing well.  She experiencing difficulty sleeping.  This is occurring every night.  She goes to bed around 11pm, she has difficulty falling asleep, some times as much as 2 hours.  When she does finally fall asleep her sleep feels very disjointed and she feels like she wakes up frequently throughout the night.  This started a month after starting the Tamoxifen.  She tried changing the times of her medications and it didn't help.  She is taking Valarian root and Ashwoganda for sleep.  She also tried essential oils to help which was lavender, and heard that lavender can turn into estrogen, so she isn't using it.  She drinks coffee in the morning, but otherwise no caffeine throughout the day.  She is trying to walk every day, however its difficult due to her  lack of sleep.    She is also having an increase in hot flashes and is having difficulty with this.  These hot flashes are happening frequently.  She is dealing with these, but wishes they were better.      REVIEW OF SYSTEMS:  Review of Systems  Constitutional: Negative for appetite change and chills.  HENT:   Negative for hearing loss and lump/mass.   Eyes: Negative for eye problems and icterus.  Respiratory: Negative for chest tightness, cough and shortness of breath.   Cardiovascular: Negative for chest pain, leg swelling and palpitations.  Gastrointestinal: Negative for abdominal distention and abdominal pain.  Endocrine: Positive for hot flashes.  Genitourinary: Negative for difficulty urinating.   Musculoskeletal: Negative for arthralgias.  Skin: Negative for itching and wound.  Neurological: Negative for dizziness, extremity weakness, headaches and numbness.  Psychiatric/Behavioral: Positive for sleep disturbance. Negative for depression. The patient is not nervous/anxious.   Breast: Denies any new nodularity, masses, tenderness, nipple changes, or nipple discharge.      ONCOLOGY TREATMENT TEAM:  1. Surgeon:  Dr. Marlou Starks at [facility] 2. Medical Oncologist: Dr. Jana Hakim  3. Radiation Oncologist: Dr. Lisbeth Renshaw    PAST MEDICAL/SURGICAL HISTORY:  Past Medical History:  Diagnosis Date  . Anemia    father has Factor V Leiden-no issues with it. Pt has not been tested  . Endometriosis   . Fibroid   . Foot  pain    bilateral - occ foot pain  . Infertility, female   . Malignant neoplasm of lower-inner quadrant of left female breast ([name]) 03/16/2016   left breast DCIS  . Obesity    Past Surgical History:  Procedure Laterality Date  . ABDOMINAL HYSTERECTOMY    . BREAST BIOPSY Left    benign  . BREAST LUMPECTOMY WITH RADIOACTIVE SEED AND SENTINEL LYMPH NODE BIOPSY Left 04/15/2016   Procedure: BREAST LUMPECTOMY WITH RADIOACTIVE SEED AND SENTINEL LYMPH NODE BIOPSY;   Surgeon: Autumn Messing III, MD;  Location: [place];  Service: General;  Laterality: Left;  BREAST LUMPECTOMY WITH RADIOACTIVE SEED AND SENTINEL LYMPH NODE BIOPSY  . DILATION AND CURETTAGE OF UTERUS    . laproscopy     three  . TONSILLECTOMY       ALLERGIES:  No Known Allergies   CURRENT MEDICATIONS:  Outpatient Encounter Prescriptions as of 10/21/2016  Medication Sig  . Coenzyme Q10 (CO Q 10 PO) Take by mouth daily.  . Nutritional Supplements (JUICE PLUS FIBRE PO) Take by mouth.  . tamoxifen (NOLVADEX) 20 MG tablet Take 1 tablet (20 mg total) by mouth daily.  Marland Kitchen VALERIAN ROOT PO Take by mouth.   Facility-Administered Encounter Medications as of 10/21/2016  Medication  . dextrose 5 % and 0.9 % NaCl with KCl 20 mEq/L infusion  . heparin injection 5,000 Units  . HYDROcodone-acetaminophen (NORCO/VICODIN) 5-325 MG per tablet 1-2 tablet  . ondansetron (ZOFRAN-ODT) disintegrating tablet 4 mg   Or  . ondansetron (ZOFRAN) 4 mg in sodium chloride 0.9 % 50 mL IVPB  . pantoprazole (PROTONIX) injection 40 mg     ONCOLOGIC FAMILY HISTORY:  Family History  Problem Relation Age of Onset  . Heart disease Father   . Heart attack Father 27  . Transient ischemic attack Mother   . Stroke Mother   . Breast cancer Maternal Grandmother 60  . Melanoma Sister 41  . Scleroderma Paternal Uncle 35  . Heart attack Paternal Grandfather 63  . Colon cancer Neg Hx   . Colon polyps Neg Hx   . Esophageal cancer Neg Hx   . Rectal cancer Neg Hx   . Stomach cancer Neg Hx      SOCIAL HISTORY:  Bethany Gardner is married and lives with her husband in [place], [place].  Ms. Cantave is currently retired.  She denies any current or history of tobacco, alcohol, or illicit drug use.     PHYSICAL EXAMINATION:  Vital Signs:   Vitals:   10/21/16 1403  BP: 119/70  Pulse: 69  Resp: 17  Temp: 98.2 F (36.8 C)   Filed Weights   General: Well-nourished, well-appearing female in no  acute distress.  She is unaccompanied today.   HEENT: Head is normocephalic.  Pupils equal and reactive to light. Conjunctivae clear without exudate.  Sclerae anicteric. Oral mucosa is pink, moist.  Oropharynx is pink without lesions or erythema.  Lymph: No cervical, supraclavicular, or infraclavicular lymphadenopathy noted on palpation.  Cardiovascular: Regular rate and rhythm.Marland Kitchen Respiratory: Clear to auscultation bilaterally. Chest expansion symmetric; breathing non-labored.  GI: Abdomen soft and round; non-tender, non-distended. Bowel sounds normoactive.  GU: Deferred.  Neuro: No focal deficits. Steady gait.  Psych: Mood and affect normal and appropriate for situation.  Extremities: No edema. MSK: No focal spinal tenderness to palpation.  Full range of motion in bilateral upper extremities Skin: Warm and dry.  LABORATORY DATA:  None for this visit.  DIAGNOSTIC IMAGING:  None  for this visit.      ASSESSMENT AND PLAN:  Ms.. Bethany Gardner is a pleasant 56 y.o. female with Stage 0 left breast DCIS, ER+/PR-/HER2-, diagnosed in 03/2016, treated with lumpectomy, adjuvant radiation therapy, and anti-estrogen therapy with Tamoxifen beginning in 03/2016.  She presents to the Survivorship Clinic for our initial meeting and routine follow-up post-completion of treatment for breast cancer.    1. Stage 0 left breast cancer:  Ms. Altman is continuing to recover from definitive treatment for breast cancer. She will follow-up with her medical oncologist, Dr. Jana Hakim in August, 2018 with history and physical exam per surveillance protocol.  Today, a comprehensive survivorship care plan and treatment summary was reviewed with the patient today detailing her breast cancer diagnosis, treatment course, potential late/long-term effects of treatment, appropriate follow-up care with recommendations for the future, and patient education resources.  A copy of this summary, along with a letter will be sent to the  patient's primary care provider via mail/fax/In Basket message after today's visit.    2. Challenges with Tamoxifen: Cynitha is clearly miserable while taking the Tamoxifen.  I told her to take a 1-2 week holiday from it.  In the meantime, we will add on [facility] and estradiol to her labs to test whether or not she is post menopausal.  (S/p TAH, no BSO, but age 67).  We will see if she feels better off of the Tamoxifen and may be able to try Anastrozole  3. Bone health:  Given Ms. Guo's age/history of breast cancer, she is at risk for bone demineralization.    She was given education on specific activities to promote bone health.  Should she change to an aromatase inhibitor soon, we will need to order bone density testing.    4. Cancer screening:  Due to Ms. Lenn's history and her age, she should receive screening for skin cancers, colon cancer, and gynecologic cancers.  The information and recommendations are listed on the patient's comprehensive care plan/treatment summary and were reviewed in detail with the patient.    5. Health maintenance and wellness promotion: Ms. Fons was encouraged to consume 5-7 servings of fruits and vegetables per day. We reviewed the "Nutrition Rainbow" handout, as well as the handout "Take Control of Your Health and Reduce Your Cancer Risk" from the [employer].  She was also encouraged to engage in moderate to vigorous exercise for 30 minutes per day most days of the week. We discussed the [employer] fitness program, which is designed for cancer survivors to help them become more physically fit after cancer treatments.  She was instructed to limit her alcohol consumption and continue to abstain from tobacco use.     6. Support services/counseling: It is not uncommon for this period of the patient's cancer care trajectory to be one of many emotions and stressors.  We discussed an opportunity for her to participate in the next session of [facility] ("Finding  Your New Normal") support group series designed for patients after they have completed treatment.   Ms. Mcanany was encouraged to take advantage of our many other support services programs, support groups, and/or counseling in coping with her new life as a cancer survivor after completing anti-cancer treatment.  She was offered support today through active listening and expressive supportive counseling.  She was given information regarding our available services and encouraged to contact me with any questions or for help enrolling in any of our support group/programs.    Dispo:   -Return to  cancer center in August, 2018 for followup with Dr. Jana Hakim -Mammogram due in 02/2017 -Follow up with Dr. Marlou Starks in 03/2017 -She is welcome to return back to the Survivorship Clinic at any time; no additional follow-up needed at this time.  -Consider referral back to survivorship as a long-term survivor for continued surveillance  A total of (30) minutes of face-to-face time was spent with this patient with greater than 50% of that time in counseling and care-coordination.   Gardenia Phlegm, NP Survivorship Program [facility] [phone]   Note: PRIMARY CARE PROVIDER Lucretia Kern, [place] [phone] '

## 2016-10-21 NOTE — Patient Instructions (Addendum)
Gabapentin capsules or tablets What is this medicine? GABAPENTIN (GA ba pen tin) is used to control partial seizures in adults with epilepsy. It is also used to treat certain types of nerve pain. This medicine may be used for other purposes; ask your health care provider or pharmacist if you have questions. COMMON BRAND NAME(S): Active-PAC with Gabapentin, Gabarone, Neurontin What should I tell my health care provider before I take this medicine? They need to know if you have any of these conditions: -kidney disease -suicidal thoughts, plans, or attempt; a previous suicide attempt by you or a family member -an unusual or allergic reaction to gabapentin, other medicines, foods, dyes, or preservatives -pregnant or trying to get pregnant -breast-feeding How should I use this medicine? Take this medicine by mouth with a glass of water. Follow the directions on the prescription label. You can take it with or without food. If it upsets your stomach, take it with food.Take your medicine at regular intervals. Do not take it more often than directed. Do not stop taking except on your doctor's advice. If you are directed to break the 600 or 800 mg tablets in half as part of your dose, the extra half tablet should be used for the next dose. If you have not used the extra half tablet within 28 days, it should be thrown away. A special MedGuide will be given to you by the pharmacist with each prescription and refill. Be sure to read this information carefully each time. Talk to your pediatrician regarding the use of this medicine in children. Special care may be needed. Overdosage: If you think you have taken too much of this medicine contact a poison control center or emergency room at once. NOTE: This medicine is only for you. Do not share this medicine with others. What if I miss a dose? If you miss a dose, take it as soon as you can. If it is almost time for your next dose, take only that dose. Do not  take double or extra doses. What may interact with this medicine? Do not take this medicine with any of the following medications: -other gabapentin products This medicine may also interact with the following medications: -alcohol -antacids -antihistamines for allergy, cough and cold -certain medicines for anxiety or sleep -certain medicines for depression or psychotic disturbances -homatropine; hydrocodone -naproxen -narcotic medicines (opiates) for pain -phenothiazines like chlorpromazine, mesoridazine, prochlorperazine, thioridazine This list may not describe all possible interactions. Give your health care provider a list of all the medicines, herbs, non-prescription drugs, or dietary supplements you use. Also tell them if you smoke, drink alcohol, or use illegal drugs. Some items may interact with your medicine. What should I watch for while using this medicine? Visit your doctor or health care professional for regular checks on your progress. You may want to keep a record at home of how you feel your condition is responding to treatment. You may want to share this information with your doctor or health care professional at each visit. You should contact your doctor or health care professional if your seizures get worse or if you have any new types of seizures. Do not stop taking this medicine or any of your seizure medicines unless instructed by your doctor or health care professional. Stopping your medicine suddenly can increase your seizures or their severity. Wear a medical identification bracelet or chain if you are taking this medicine for seizures, and carry a card that lists all your medications. You may get drowsy, dizzy,   or have blurred vision. Do not drive, use machinery, or do anything that needs mental alertness until you know how this medicine affects you. To reduce dizzy or fainting spells, do not sit or stand up quickly, especially if you are an older patient. Alcohol can  increase drowsiness and dizziness. Avoid alcoholic drinks. Your mouth may get dry. Chewing sugarless gum or sucking hard candy, and drinking plenty of water will help. The use of this medicine may increase the chance of suicidal thoughts or actions. Pay special attention to how you are responding while on this medicine. Any worsening of mood, or thoughts of suicide or dying should be reported to your health care professional right away. Women who become pregnant while using this medicine may enroll in the [place] Pregnancy Registry by calling [phone]. This registry collects information about the safety of antiepileptic drug use during pregnancy. What side effects may I notice from receiving this medicine? Side effects that you should report to your doctor or health care professional as soon as possible: -allergic reactions like skin rash, itching or hives, swelling of the face, lips, or tongue -worsening of mood, thoughts or actions of suicide or dying Side effects that usually do not require medical attention (report to your doctor or health care professional if they continue or are bothersome): -constipation -difficulty walking or controlling muscle movements -dizziness -nausea -slurred speech -tiredness -tremors -weight gain This list may not describe all possible side effects. Call your doctor for medical advice about side effects. You may report side effects to FDA at 1-800-FDA-1088. Where should I keep my medicine? Keep out of reach of children. This medicine may cause accidental overdose and death if it taken by other adults, children, or pets. Mix any unused medicine with a substance like cat litter or coffee grounds. Then throw the medicine away in a sealed container like a sealed bag or a coffee can with a lid. Do not use the medicine after the expiration date. Store at room temperature between 15 and 30 degrees C (59 and 86 degrees F). NOTE: This  sheet is a summary. It may not cover all possible information. If you have questions about this medicine, talk to your doctor, pharmacist, or health care provider.  2018 Elsevier/Gold Standard (2013-05-24 15:26:50) How to Perform the Epley Maneuver The Epley maneuver is an exercise that relieves symptoms of vertigo. Vertigo is the feeling that you or your surroundings are moving when they are not. When you feel vertigo, you may feel like the room is spinning and have trouble walking. Dizziness is a little different than vertigo. When you are dizzy, you may feel unsteady or light-headed. You can do this maneuver at home whenever you have symptoms of vertigo. You can do it up to 3 times a day until your symptoms go away. Even though the Epley maneuver may relieve your vertigo for a few weeks, it is possible that your symptoms will return. This maneuver relieves vertigo, but it does not relieve dizziness. What are the risks? If it is done correctly, the Epley maneuver is considered safe. Sometimes it can lead to dizziness or nausea that goes away after a short time. If you develop other symptoms, such as changes in vision, weakness, or numbness, stop doing the maneuver and call your health care provider. How to perform the Epley maneuver 1. Sit on the edge of a bed or table with your back straight and your legs extended or hanging over the edge of the  bed or table. 2. Turn your head halfway toward the affected ear or side. 3. Lie backward quickly with your head turned until you are lying flat on your back. You may want to position a pillow under your shoulders. 4. Hold this position for 30 seconds. You may experience an attack of vertigo. This is normal. 5. Turn your head to the opposite direction until your unaffected ear is facing the floor. 6. Hold this position for 30 seconds. You may experience an attack of vertigo. This is normal. Hold this position until the vertigo stops. 7. Turn your whole body  to the same side as your head. Hold for another 30 seconds. 8. Sit back up. You can repeat this exercise up to 3 times a day. Follow these instructions at home:  After doing the Epley maneuver, you can return to your normal activities.  Ask your health care provider if there is anything you should do at home to prevent vertigo. He or she may recommend that you: ? Keep your head raised (elevated) with two or more pillows while you sleep. ? Do not sleep on the side of your affected ear. ? Get up slowly from bed. ? Avoid sudden movements during the day. ? Avoid extreme head movement, like looking up or bending over. Contact a health care provider if:  Your vertigo gets worse.  You have other symptoms, including: ? Nausea. ? Vomiting. ? Headache. Get help right away if:  You have vision changes.  You have a severe or worsening headache or neck pain.  You cannot stop vomiting.  You have new numbness or weakness in any part of your body. Summary  Vertigo is the feeling that you or your surroundings are moving when they are not.  The Epley maneuver is an exercise that relieves symptoms of vertigo.  If the Epley maneuver is done correctly, it is considered safe. You can do it up to 3 times a day. This information is not intended to replace advice given to you by your health care provider. Make sure you discuss any questions you have with your health care provider. Document Released: 04/02/2013 Document Revised: 02/16/2016 Document Reviewed: 02/16/2016 Elsevier Interactive Patient Education  2017 [employer].

## 2016-10-22 LAB — LUTEINIZING HORMONE: LH: 26.5 m[IU]/mL

## 2016-10-22 LAB — ESTRADIOL: Estradiol: 7.1 pg/mL

## 2016-10-22 LAB — FOLLICLE STIMULATING HORMONE: FSH: 50.1 m[IU]/mL

## 2016-10-23 ENCOUNTER — Encounter: Payer: Self-pay | Admitting: Adult Health

## 2016-11-04 ENCOUNTER — Telehealth: Payer: Self-pay | Admitting: *Deleted

## 2016-11-04 NOTE — Telephone Encounter (Signed)
Per message received on VM - stating she has not had follow up regarding mammogram.  Note MD ordered mammogram for 6 month post completion of therapy for breast cancer for new baseline based on NCCN guidelines.  Pt's last mammo was Nov 2017 which was when she was diagnosed with breast cancer.  Per prior contact with the [facility] - pt and this RN are being informed that per insurance coverage - mammo cannot be done until November.  Plan 2 weeks ago was for MD to speak with the Breast [facility] per above request.  Due to MD out of office and this RN unable to follow up with him per above - this RN obtained number for Truman Hayward at the [facility] for better clarification and patient contact regarding request.  This RN spoke with the patient and informed her of the above with plan for her to contact this RN next Tuesday to follow up.

## 2016-11-08 ENCOUNTER — Other Ambulatory Visit: Payer: Self-pay | Admitting: *Deleted

## 2016-11-08 ENCOUNTER — Other Ambulatory Visit: Payer: Self-pay | Admitting: Oncology

## 2016-11-08 DIAGNOSIS — Z170 Estrogen receptor positive status [ER+]: Principal | ICD-10-CM

## 2016-11-08 DIAGNOSIS — C50312 Malignant neoplasm of lower-inner quadrant of left female breast: Secondary | ICD-10-CM

## 2016-11-08 DIAGNOSIS — N644 Mastodynia: Secondary | ICD-10-CM

## 2016-11-10 ENCOUNTER — Telehealth: Payer: Self-pay | Admitting: *Deleted

## 2016-11-10 NOTE — Telephone Encounter (Signed)
"  Felia from [place] calling to notify office order request has been faxed to [phone].  Need provider signature for patient to receive mammogram tomorrow at 11:00 am."   Provider out of office at this time. "Another provider can sign the faxed request." Message left to notify [facility] of this request.

## 2016-11-11 ENCOUNTER — Ambulatory Visit: Payer: Managed Care, Other (non HMO)

## 2016-11-11 ENCOUNTER — Other Ambulatory Visit: Payer: Self-pay | Admitting: Hematology and Oncology

## 2016-11-11 ENCOUNTER — Ambulatory Visit
Admission: RE | Admit: 2016-11-11 | Discharge: 2016-11-11 | Disposition: A | Discharge status: Home / Self Care | Payer: Managed Care, Other (non HMO) | Source: Ambulatory Visit | Attending: Oncology | Admitting: Oncology

## 2016-11-11 DIAGNOSIS — N644 Mastodynia: Secondary | ICD-10-CM

## 2016-11-11 DIAGNOSIS — Z170 Estrogen receptor positive status [ER+]: Principal | ICD-10-CM

## 2016-11-11 DIAGNOSIS — C50312 Malignant neoplasm of lower-inner quadrant of left female breast: Secondary | ICD-10-CM

## 2016-11-11 HISTORY — DX: Personal history of irradiation: Z92.3

## 2016-11-29 ENCOUNTER — Ambulatory Visit (HOSPITAL_BASED_OUTPATIENT_CLINIC_OR_DEPARTMENT_OTHER): Payer: Managed Care, Other (non HMO) | Admitting: Oncology

## 2016-11-29 VITALS — BP 118/69 | HR 68 | Temp 98.7°F | Resp 18 | Ht 65.25 in

## 2016-11-29 DIAGNOSIS — G4700 Insomnia, unspecified: Secondary | ICD-10-CM | POA: Diagnosis not present

## 2016-11-29 DIAGNOSIS — C50312 Malignant neoplasm of lower-inner quadrant of left female breast: Secondary | ICD-10-CM

## 2016-11-29 DIAGNOSIS — Z170 Estrogen receptor positive status [ER+]: Secondary | ICD-10-CM

## 2016-11-29 DIAGNOSIS — D0512 Intraductal carcinoma in situ of left breast: Secondary | ICD-10-CM | POA: Diagnosis not present

## 2016-11-29 NOTE — Progress Notes (Signed)
[place]  Telephone:([phone]) [phone] Fax:(336) [phone]     ID: Aston Lawhorn DOB: 1960/12/21  MR#: [id number]  MWU#:[id number]  Patient Care Team: Lucretia Kern, DO as PCP - General (Family Medicine) Jovita Kussmaul, MD as Consulting Physician (General Surgery) Sue Fernicola, Virgie Dad, MD as Consulting Physician (Oncology) Kyung Rudd, MD as Consulting Physician (Radiation Oncology) Salvadore Dom, MD as Consulting Physician (Obstetrics and Gynecology) Delice Bison, Charlestine Massed, NP as Nurse Practitioner (Hematology and Oncology) Chauncey Cruel, MD OTHER MD:  CHIEF COMPLAINT: Ductal carcinoma in situ  CURRENT TREATMENT: [Tamoxifen]   BREAST CANCER HISTORY: From the original consult note:  Abigael had screening mammography with tomography at the [facility] 03/08/2016 showing suspicious calcifications in the left breast. Left diagnostic mammography with tomography and left breast ultrasonography 03/14/2016 found the breast density to be category B. In the lower inner quadrant of the left breast there were calcifications measuring 2.1 cm, in a linear arrangement. There was also an area of asymmetry but no palpable abnormality on exam. Ultrasonography of the breast was noninformative an ultrasound of the breast was mammographically benign.  Biopsy of the area of calcifications 03/14/2016 showed ([id number]) ductal carcinoma in situ, grade 3, involving a complex sclerosing lesion. Some areas were suspicious but not diagnostic for invasion. This was estrogen receptor positive at 60% with moderate staining, but progesterone receptor negative.  Her subsequent history is as detailed below  INTERVAL HISTORY: Jaydon returns today for follow-up and treatment of her estrogen receptor positive breast cancer. She took tamoxifen for about 3 months but had significant side effects, mostly insomnia. She stopped it for a little while, came for her survivorship visit in July, and after  brief discussion decided to go off it for a couple weeks, during which she felt better. She still did not sleep all that well.  On 10/21/2016 her [place] was 50.1 (it was 100.9 a year ago) andestradiol was 7.1, in the postmenopausal range  She had a little bit of nipple crusting which she brought to the attention at the time of mammography. Mammography and ultrasonography were unremarkable.  REVIEW OF SYSTEMS: She gets on the elliptical 30 minutes every day. She is scheduled to do the [employer] program starting 12/13/2016. A detailed review of systems today was otherwise stable  PAST MEDICAL HISTORY: Past Medical History:  Diagnosis Date  . Anemia    father has Factor V Leiden-no issues with it. Pt has not been tested  . Endometriosis   . Fibroid   . Foot pain    bilateral - occ foot pain  . Infertility, female   . Malignant neoplasm of lower-inner quadrant of left female breast ([place]) 03/16/2016   left breast DCIS  . Obesity   . Personal history of radiation therapy 2018    PAST SURGICAL HISTORY: Past Surgical History:  Procedure Laterality Date  . ABDOMINAL HYSTERECTOMY    . BREAST BIOPSY Left    benign  . BREAST LUMPECTOMY Left 2018  . BREAST LUMPECTOMY WITH RADIOACTIVE SEED AND SENTINEL LYMPH NODE BIOPSY Left 04/15/2016   Procedure: BREAST LUMPECTOMY WITH RADIOACTIVE SEED AND SENTINEL LYMPH NODE BIOPSY;  Surgeon: Autumn Messing III, MD;  Location: [facility];  Service: General;  Laterality: Left;  BREAST LUMPECTOMY WITH RADIOACTIVE SEED AND SENTINEL LYMPH NODE BIOPSY  . DILATION AND CURETTAGE OF UTERUS    . laproscopy     three  . TONSILLECTOMY      FAMILY HISTORY Family History  Problem Relation Age  of Onset  . Heart disease Father   . Heart attack Father 76  . Transient ischemic attack Mother   . Stroke Mother   . Breast cancer Maternal Grandmother 60  . Melanoma Sister 59  . Scleroderma Paternal Uncle 57  . Heart attack Paternal Grandfather 31  . Colon  cancer Neg Hx   . Colon polyps Neg Hx   . Esophageal cancer Neg Hx   . Rectal cancer Neg Hx   . Stomach cancer Neg Hx   The patient's parents are living, in their late 67s as of December 2017. The patient has one brother, 2 sisters. The patient's sister was diagnosed with melanoma at age 12. There is no history of breast or ovarian cancer in the family.   GYNECOLOGIC HISTORY:  No LMP recorded. Patient has had a hysterectomy. Menarche age 24, first live birth age 38. The patient is GX P1. She underwent hysterectomy with no salpingo-oophorectomy in 2010. She did not take hormone replacement. 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 They moved here from the [facility] [place] area in 2016. The patient's daughter patent is an Statistician in [place]. The patient's adopted son Laurann Montana lives at home with the patient.     ADVANCED DIRECTIVES: not in place   HEALTH MAINTENANCE: Social History  Substance Use Topics  . Smoking status: Never Smoker  . Smokeless tobacco: Never Used  . Alcohol use No     Colonoscopy:October 2016, [facility]  PAP:  Bone density:   No Known Allergies  Current Outpatient Prescriptions  Medication Sig Dispense Refill  . Coenzyme Q10 (CO Q 10 PO) Take by mouth daily.    . Nutritional Supplements (JUICE PLUS FIBRE PO) Take by mouth.    . tamoxifen (NOLVADEX) 20 MG tablet Take 1 tablet (20 mg total) by mouth daily. 30 tablet 12  . VALERIAN ROOT PO Take by mouth.     No current facility-administered medications for this visit.    Facility-Administered Medications Ordered in Other Visits  Medication Dose Route Frequency Provider Last Rate Last Dose  . dextrose 5 % and 0.9 % NaCl with KCl 20 mEq/L infusion   Intravenous Continuous Autumn Messing III, MD      . heparin  injection 5,000 Units  5,000 Units Subcutaneous Q8H Autumn Messing III, MD      . HYDROcodone-acetaminophen (NORCO/VICODIN) 5-325 MG per tablet 1-2 tablet  1-2 tablet Oral Q4H PRN Autumn Messing III, MD      . ondansetron (ZOFRAN-ODT) disintegrating tablet 4 mg  4 mg Oral Q6H PRN Autumn Messing III, MD       Or  . ondansetron (ZOFRAN) 4 mg in sodium chloride 0.9 % 50 mL IVPB  4 mg Intravenous Q6H PRN Autumn Messing III, MD      . pantoprazole (PROTONIX) injection 40 mg  40 mg Intravenous QHS Autumn Messing III, MD        OBJECTIVE:  Middle aged white woman In no acute distress  Vitals:   11/29/16 1434  BP: 118/69  Pulse: 68  Resp: 18  Temp: 98.7 F (37.1 C)  SpO2: 100%     There is no height or weight on file to calculate BMI.    ECOG FS:0 - Asymptomatic  Sclerae unicteric, EOMs intact Oropharynx clear and moist  No cervical or supraclavicular adenopathy Lungs no rales or rhonchi Heart regular rate and rhythm Abd soft, nontender, positive bowel sounds MSK no focal spinal tenderness, no upper extremity lymphedema; the area of discomfort in the left flank is entirely normal. Neuro: nonfocal, well oriented, appropriate affect Breasts: The right breast is unremarkable. The left breast is status post lumpectomy and radiation. There is no evidence of local recurrence.There is no apparent crusting of the nipple or nipple discharge. Both axillae are benign.   LAB RESULTS:  CMP     Component Value Date/Time   NA 141 05/04/2016 1354   NA 141 03/23/2016 1240   K 3.9 05/04/2016 1354   K 4.7 03/23/2016 1240   CL 107 05/04/2016 1354   CO2 27 05/04/2016 1354   CO2 25 03/23/2016 1240   GLUCOSE 131 (H) 05/04/2016 1354   GLUCOSE 97 03/23/2016 1240   BUN 9 05/04/2016 1354   BUN 13.0 03/23/2016 1240   CREATININE 0.67 05/04/2016 1354   CREATININE 0.7 03/23/2016 1240   CALCIUM 9.2 05/04/2016 1354   CALCIUM 9.6 03/23/2016 1240   PROT 7.8 03/23/2016 1240   ALBUMIN 4.0 03/23/2016 1240   AST 21 03/23/2016 1240    ALT 20 03/23/2016 1240   ALKPHOS 79 03/23/2016 1240   BILITOT 0.42 03/23/2016 1240   GFRNONAA >60 05/04/2016 1354   GFRAA >60 05/04/2016 1354    INo results found for: SPEP, UPEP  Lab Results  Component Value Date   WBC 5.5 05/04/2016   NEUTROABS 3.5 05/04/2016   HGB 12.1 05/04/2016   HCT 35.1 (L) 05/04/2016   MCV 91.9 05/04/2016   PLT 267 05/04/2016      Chemistry      Component Value Date/Time   NA 141 05/04/2016 1354   NA 141 03/23/2016 1240   K 3.9 05/04/2016 1354   K 4.7 03/23/2016 1240   CL 107 05/04/2016 1354   CO2 27 05/04/2016 1354   CO2 25 03/23/2016 1240   BUN 9 05/04/2016 1354   BUN 13.0 03/23/2016 1240   CREATININE 0.67 05/04/2016 1354   CREATININE 0.7 03/23/2016 1240      Component Value Date/Time   CALCIUM 9.2 05/04/2016 1354   CALCIUM 9.6 03/23/2016 1240   ALKPHOS 79 03/23/2016 1240   AST 21 03/23/2016 1240   ALT 20 03/23/2016 1240   BILITOT 0.42 03/23/2016 1240       No results found for: LABCA2  No components found for: LABCA125  No results for input(s): INR in the last 168 hours.  Urinalysis No results found for: COLORURINE, APPEARANCEUR, LABSPEC, PHURINE, GLUCOSEU, HGBUR, BILIRUBINUR, KETONESUR, PROTEINUR, UROBILINOGEN, NITRITE, LEUKOCYTESUR   STUDIES: US Breast Ltd Uni Left Inc Axilla  Result Date: 11/11/2016 CLINICAL DATA:  History of treated left breast high-grade DCIS, status post lumpectomy, and radiation therapy in January 2018. Patient complains of diffuse left breast pain and left nipple crusting. Patient is taking tamoxifen. EXAM: 2D DIGITAL DIAGNOSTIC BILATERAL MAMMOGRAM WITH CAD AND ADJUNCT TOMO ULTRASOUND LEFT BREAST COMPARISON:  First postsurgical mammogram. ACR Breast Density Category c: The breast tissue is heterogeneously dense, which may obscure small masses. FINDINGS: Mammographically, there are no suspicious masses, areas of nonsurgical architectural distortion or microcalcifications in either breast. Expected post  lumpectomy changes are seen in the left breast lower inner quadrant. There is slight left breast skin thickening, also expected post radiation change. Mammographic images were processed with CAD. On physical exam, no suspicious masses are palpated. Slight subareolar skin thickening is noted. No  spontaneous left nipple discharge is seen today. Targeted ultrasound is performed, showing no suspicious masses or shadowing lesions. No evidence of duct ectasia or intraductal masses. IMPRESSION: No mammographic or sonographic evidence of malignancy in either breast, status post left lumpectomy. RECOMMENDATION: Further management of patient's left nipple discharge should be based on clinical grounds. Otherwise, Diagnostic mammogram is suggested in 1 year. (Code:DM-B-01Y) I have discussed the findings and recommendations with the patient. Results were also provided in writing at the conclusion of the visit. If applicable, a reminder letter will be sent to the patient regarding the next appointment. BI-RADS CATEGORY  2: Benign. Electronically Signed   By: Fidela Salisbury M.D.   On: 11/11/2016 12:22   Mm Diag Breast Tomo Bilateral  Result Date: 11/11/2016 CLINICAL DATA:  History of treated left breast high-grade DCIS, status post lumpectomy, and radiation therapy in January 2018. Patient complains of diffuse left breast pain and left nipple crusting. Patient is taking tamoxifen. EXAM: 2D DIGITAL DIAGNOSTIC BILATERAL MAMMOGRAM WITH CAD AND ADJUNCT TOMO ULTRASOUND LEFT BREAST COMPARISON:  First postsurgical mammogram. ACR Breast Density Category c: The breast tissue is heterogeneously dense, which may obscure small masses. FINDINGS: Mammographically, there are no suspicious masses, areas of nonsurgical architectural distortion or microcalcifications in either breast. Expected post lumpectomy changes are seen in the left breast lower inner quadrant. There is slight left breast skin thickening, also expected post radiation  change. Mammographic images were processed with CAD. On physical exam, no suspicious masses are palpated. Slight subareolar skin thickening is noted. No spontaneous left nipple discharge is seen today. Targeted ultrasound is performed, showing no suspicious masses or shadowing lesions. No evidence of duct ectasia or intraductal masses. IMPRESSION: No mammographic or sonographic evidence of malignancy in either breast, status post left lumpectomy. RECOMMENDATION: Further management of patient's left nipple discharge should be based on clinical grounds. Otherwise, Diagnostic mammogram is suggested in 1 year. (Code:DM-B-01Y) I have discussed the findings and recommendations with the patient. Results were also provided in writing at the conclusion of the visit. If applicable, a reminder letter will be sent to the patient regarding the next appointment. BI-RADS CATEGORY  2: Benign. Electronically Signed   By: Fidela Salisbury M.D.   On: 11/11/2016 12:22    ELIGIBLE FOR AVAILABLE RESEARCH PROTOCOL:  no  ASSESSMENT: 56 y.o. [place] woman status post left breast lower inner quadrant biopsy 03/14/2016 for ductal carcinoma in situ, grade 3, estrogen receptor positive, progesterone receptor negative  (1) breast conserving surgery with sentinel lymph node sampling planned  (2) adjuvant radiation to be completed 07/09/2015  (3)  Tamoxifen started 03/24/2016 neoadjuvantly, held during radiation, resumed April 2017  (4) the patient qualifies for genetics testing, but declined (see note 07/12/2016  PLAN: I spent approximately 40 minutes with Calan going over her situation. She is not tolerating tamoxifen well. She blames it for her insomnia. We discussed the fact that in placebo controlled randomized studies insomnia appears to be about the same in both groups, the placebo group in the tamoxifen group. Nevertheless this is the main problem and the main focus of our discussion today.  She is  trying Mateo Flow and an other alternative herbal medications in an attempt to sleep better, so far without success. She does not like the idea of trying Benadryl. She is considering a semi-estrogen which "does not have estrogenic properties" and she brought me some information regarding this.  We discussed the difference between an anecdotal and a controlled randomized study and the  difference between is studying humans and an in vitro study.  We reviewed the data that anti-estrogens will reduce her risk of recurrence in half and her risk of developing a new breast cancer in half. We also reviewed the fact that her tumor was not invasive and therefore not life-threatening.  I think the best way to proceed is to stop the tamoxifen for 3 months and let her work on trying to get her sleep under control. In that regard I suggest that she always wake up at the same time of day, which she tells me will be 8 AM. She can go to bed when she likes, take anything that she wants to take to help her sleep, but she will get out of bed at 8 AM whether she sleeps the prior evening or not. Of course she will not take daytime naps. She will keep the bedroom cold. She tells me she already does that. Her husband is already sleeping in a different room which will simplify the issue by eliminating snores and other complications  She will call us the first week in November. If she finds that she is sleeping 6 or more hours a day that clearly the tamoxifen must of been the reason for her insomnia and we will consider anastrozole. However if the insomnia is just as bad at that time then most likely it is due to menopause and that she can either go back to tamoxifen if she wishes she can switch to anastrozole at that point  She will call with any other problems and assuming there are no other problems she will see me again in a year  Chauncey Cruel, MD   11/29/2016 2:49 PM Medical Oncology and Hematology [facility] [place], [place] [address] Tel. [phone]    Fax. [phone]

## 2016-12-02 ENCOUNTER — Other Ambulatory Visit: Payer: Self-pay | Admitting: *Deleted

## 2016-12-02 ENCOUNTER — Telehealth: Payer: Self-pay | Admitting: *Deleted

## 2016-12-02 NOTE — Telephone Encounter (Signed)
This RN spoke with Bethany Gardner per call stating onset pm 8/21 of " large lymph node that just popped up ".  Nodule is tender to touch - position is below the earlobe " at the back of the jaw line "  Pt states she " is having night sweats too ".  Per discussion including that she was seen am of 8/21 by MD- Bethany Gardner states night sweats have been occurring ever since she started the tamoxifen " and Dr Jana Hakim told me that it may take a while for the tamoxifen to get fully out of my system "  Note Bethany Gardner states " I did google my symptoms and am concerned that it could be lymphoma "  This RN informed Bethany Gardner - above is very unlikely - and more likely a sebaceous cyst or a blocked salivary gland.  Discussed use of hot fluid intake as well as sour candy or fruit to assist with opening of gland. She can also apply warm compresses.  Request given for pt to call this RN on Monday for update for MD review and documentation as well as further concerns if symptoms are not better.  Bethany Gardner verbalized understanding and appreciation - no other needs at this time.

## 2016-12-04 ENCOUNTER — Encounter (HOSPITAL_COMMUNITY): Payer: Self-pay | Admitting: Emergency Medicine

## 2016-12-04 ENCOUNTER — Emergency Department (HOSPITAL_COMMUNITY): Payer: Managed Care, Other (non HMO)

## 2016-12-04 ENCOUNTER — Emergency Department (HOSPITAL_COMMUNITY)
Admission: EM | Admit: 2016-12-04 | Discharge: 2016-12-04 | Disposition: A | Discharge status: Home / Self Care | Payer: Managed Care, Other (non HMO) | Source: Home / Self Care | Attending: Emergency Medicine | Admitting: Emergency Medicine

## 2016-12-04 DIAGNOSIS — K1120 Sialoadenitis, unspecified: Secondary | ICD-10-CM

## 2016-12-04 DIAGNOSIS — Z79899 Other long term (current) drug therapy: Secondary | ICD-10-CM | POA: Insufficient documentation

## 2016-12-04 LAB — CBC WITH DIFFERENTIAL/PLATELET
BASOS ABS: 0 10*3/uL (ref 0.0–0.1)
BASOS PCT: 0 %
EOS ABS: 0 10*3/uL (ref 0.0–0.7)
Eosinophils Relative: 0 %
HCT: 37.4 % (ref 36.0–46.0)
Hemoglobin: 12.8 g/dL (ref 12.0–15.0)
LYMPHS ABS: 0.6 10*3/uL — AB (ref 0.7–4.0)
Lymphocytes Relative: 8 %
MCH: 31.5 pg (ref 26.0–34.0)
MCHC: 34.2 g/dL (ref 30.0–36.0)
MCV: 92.1 fL (ref 78.0–100.0)
Monocytes Absolute: 0.5 10*3/uL (ref 0.1–1.0)
Monocytes Relative: 7 %
Neutro Abs: 6.5 10*3/uL (ref 1.7–7.7)
Neutrophils Relative %: 85 %
PLATELETS: 180 10*3/uL (ref 150–400)
RBC: 4.06 MIL/uL (ref 3.87–5.11)
RDW: 13 % (ref 11.5–15.5)
WBC: 7.7 10*3/uL (ref 4.0–10.5)

## 2016-12-04 LAB — COMPREHENSIVE METABOLIC PANEL
ALT: 32 U/L (ref 14–54)
AST: 38 U/L (ref 15–41)
Albumin: 4.1 g/dL (ref 3.5–5.0)
Alkaline Phosphatase: 60 U/L (ref 38–126)
Anion gap: 8 (ref 5–15)
BUN: 11 mg/dL (ref 6–20)
CALCIUM: 9.1 mg/dL (ref 8.9–10.3)
CO2: 25 mmol/L (ref 22–32)
CREATININE: 0.65 mg/dL (ref 0.44–1.00)
Chloride: 107 mmol/L (ref 101–111)
GFR calc non Af Amer: >60 mL/min (ref >60)
GLUCOSE: 106 mg/dL — AB (ref 65–99)
Potassium: 3.9 mmol/L (ref 3.5–5.1)
SODIUM: 140 mmol/L (ref 135–145)
TOTAL PROTEIN: 7.6 g/dL (ref 6.5–8.1)
Total Bilirubin: 0.6 mg/dL (ref 0.3–1.2)

## 2016-12-04 MED ORDER — IOPAMIDOL (ISOVUE-300) INJECTION 61%
INTRAVENOUS | Status: AC
  Administered 2016-12-04: 75 mL
  Filled 2016-12-04: qty 75

## 2016-12-04 MED ORDER — IBUPROFEN 200 MG PO TABS
600.0000 mg | ORAL_TABLET | Freq: Once | ORAL | Status: AC
  Administered 2016-12-04: 600 mg via ORAL
  Filled 2016-12-04: qty 3

## 2016-12-04 MED ORDER — SODIUM CHLORIDE 0.9 % IV SOLN: INTRAVENOUS | Status: DC

## 2016-12-04 MED ORDER — IBUPROFEN 600 MG PO TABS: 600.0000 mg | ORAL_TABLET | Freq: Four times a day (QID) | ORAL | 0 refills | Status: AC | PRN

## 2016-12-04 NOTE — ED Notes (Signed)
Patient was alert, oriented and stable upon discharge. RN went over AVS and patient had no further questions.  

## 2016-12-04 NOTE — ED Triage Notes (Signed)
Pt with swelling to R neck / jaw x several days and seen at urgent care center and treated for infection / swollen lymph nodes. Pt states pain and swelling has worsened, has taken 3 doses of Clindamycin. Pt states she noticed a swollen lymph node in LLE yesterday. Advised by UC to be evaluated in ED. Denies fever.

## 2016-12-04 NOTE — ED Notes (Signed)
Attmepted IV x 1 in R hand. No success. Dillon Bjork RN to try. Pt restricted on L side due to lumpectomy.

## 2016-12-04 NOTE — ED Provider Notes (Signed)
[facility] DEPT Provider Note   CSN: [id number] Arrival date & time: 12/04/16  1009     History   Chief Complaint Chief Complaint  Patient presents with  . swelling to R neck    HPI Bethany Gardner is a 56 y.o. female.  56 year old female presents with several-day history of right-sided neck swelling. No fever or chills. No intraoral swelling noted. Seen at urgent care yesterday and placed on clindamycin and symptoms have not improved. Does have a remote history of breast cancer. Denies any recent history of weight loss or night sweats. Denies any ear pain or drainage. No recent history of trauma. Nothing makes her symptoms better or worse.      Past Medical History:  Diagnosis Date  . Anemia    father has Factor V Leiden-no issues with it. Pt has not been tested  . Endometriosis   . Fibroid   . Foot pain    bilateral - occ foot pain  . Infertility, female   . Malignant neoplasm of lower-inner quadrant of left female breast ([place]) 03/16/2016   left breast DCIS  . Obesity   . Personal history of radiation therapy 2018    Patient Active Problem List   Diagnosis Date Noted  . 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 Malignant neoplasm of lower-inner quadrant of left breast in female, estrogen receptor positive ([place]) 03/16/2016  . Paresthesia 04/27/2015  . Benign paroxysmal positional vertigo 04/27/2015  . BMI 32.0-32.9,adult 09/10/2014  . Metatarsalgia of both feet 04/29/2014  . Capsulitis 04/29/2014    Past Surgical History:  Procedure Laterality Date  . ABDOMINAL HYSTERECTOMY    . BREAST BIOPSY Left    benign  . BREAST LUMPECTOMY Left 2018  . BREAST LUMPECTOMY WITH RADIOACTIVE SEED AND SENTINEL LYMPH NODE BIOPSY Left 04/15/2016   Procedure: BREAST LUMPECTOMY WITH RADIOACTIVE SEED AND SENTINEL LYMPH NODE BIOPSY;  Surgeon: Autumn Messing III, MD;  Location: [facility];  Service: General;  Laterality: Left;  BREAST LUMPECTOMY WITH  RADIOACTIVE SEED AND SENTINEL LYMPH NODE BIOPSY  . DILATION AND CURETTAGE OF UTERUS    . laproscopy     three  . TONSILLECTOMY      OB History    Gravida Para Term Preterm AB Living   2 1 1   1 1    SAB TAB Ectopic Multiple Live Births   1       1       Home Medications    Prior to Admission medications   Medication Sig Start Date End Date Taking? Authorizing Provider  clindamycin (CLEOCIN) 300 MG capsule Take 300 mg by mouth 3 (three) times daily. 12/03/16  Yes [provider]  Coenzyme Q10 (CO Q 10 PO) Take by mouth daily.   Yes [provider]  naproxen (NAPROSYN) 500 MG tablet Take 500 mg by mouth 2 (two) times daily as needed for pain. 12/03/16  Yes [provider]  Nutritional Supplements (JUICE PLUS FIBRE PO) Take by mouth.   Yes [provider]  tamoxifen (NOLVADEX) 20 MG tablet Take 1 tablet (20 mg total) by mouth daily. 07/04/16  Yes Magrinat, Virgie Dad, MD  TURMERIC CURCUMIN PO Take 1 capsule by mouth daily.   Yes [provider]  VALERIAN ROOT PO Take by mouth.   Yes [provider]    Family History Family History  Problem Relation Age of Onset  . Heart disease Father   . Heart attack Father 33  .  Transient ischemic attack Mother   . Stroke Mother   . Breast cancer Maternal Grandmother 60  . Melanoma Sister 50  . Scleroderma Paternal Uncle 67  . Heart attack Paternal Grandfather 71  . Colon cancer Neg Hx   . Colon polyps Neg Hx   . Esophageal cancer Neg Hx   . Rectal cancer Neg Hx   . Stomach cancer Neg Hx     Social History Social History  Substance Use Topics  . Smoking status: Never Smoker  . Smokeless tobacco: Never Used  . Alcohol use No     Allergies   Patient has no known allergies.   Review of Systems Review of Systems  All other systems reviewed and are negative.    Physical Exam Updated Vital Signs BP (!) 146/85 (BP Location: Left Arm)   Pulse (!) 103   Temp 98.8 F (37.1 C)  (Oral)   Resp 16   SpO2 100%   Physical Exam  Constitutional: She is oriented to person, place, and time. She appears well-developed and well-nourished.  Non-toxic appearance. No distress.  HENT:  Head: Normocephalic and atraumatic.  Eyes: Pupils are equal, round, and reactive to light. Conjunctivae, EOM and lids are normal.  Neck: Normal range of motion. Neck supple. No tracheal deviation present. No thyroid mass present.    Cardiovascular: Normal rate, regular rhythm and normal heart sounds.  Exam reveals no gallop.   No murmur heard. Pulmonary/Chest: Effort normal and breath sounds normal. No stridor. No respiratory distress. She has no decreased breath sounds. She has no wheezes. She has no rhonchi. She has no rales.  Abdominal: Soft. Normal appearance and bowel sounds are normal. She exhibits no distension. There is no tenderness. There is no rebound and no CVA tenderness.  Musculoskeletal: Normal range of motion. She exhibits no edema or tenderness.  Neurological: She is alert and oriented to person, place, and time. She has normal strength. No cranial nerve deficit or sensory deficit. GCS eye subscore is 4. GCS verbal subscore is 5. GCS motor subscore is 6.  Skin: Skin is warm and dry. No abrasion and no rash noted.  Psychiatric: She has a normal mood and affect. Her speech is normal and behavior is normal.  Nursing note and vitals reviewed.    ED Treatments / Results  Labs (all labs ordered are listed, but only abnormal results are displayed) Labs Reviewed  CBC WITH DIFFERENTIAL/PLATELET  COMPREHENSIVE METABOLIC PANEL    EKG  EKG Interpretation None       Radiology No results found.  Procedures Procedures (including critical care time)  Medications Ordered in ED Medications  0.9 %  sodium chloride infusion (not administered)     Initial Impression / Assessment and Plan / ED Course  I have reviewed the triage vital signs and the nursing notes.  Pertinent  labs & imaging results that were available during my care of the patient were reviewed by me and considered in my medical decision making (see chart for details).     Patient started on clindamycin and CT scan consistent with parotiditis. CBC normal. Patient has known history of thyroid nodule which is being worked up as an outpatient already. She scheduled to see an endocrinologist for this. Patient's immunizations series is up-to-date. Suspect that this is likely viral in etiology by patient encouraged to continue taking clindamycin and follow up with Dr.  Final Clinical Impressions(s) / ED Diagnoses   Final diagnoses:  None    New Prescriptions New  Prescriptions   No medications on file     Lacretia Leigh, MD 12/04/16 1420

## 2016-12-04 NOTE — ED Notes (Signed)
RN at bedside w/ [place] machine for US guided IV.

## 2016-12-04 NOTE — ED Notes (Signed)
Patient transported to CT 

## 2016-12-05 ENCOUNTER — Encounter: Payer: Self-pay | Admitting: Family Medicine

## 2016-12-05 ENCOUNTER — Inpatient Hospital Stay (HOSPITAL_COMMUNITY)
Admission: EM | Admit: 2016-12-05 | Discharge: 2016-12-07 | DRG: 155 | Disposition: A | Discharge status: Home / Self Care | Payer: Managed Care, Other (non HMO) | Attending: Internal Medicine | Admitting: Internal Medicine

## 2016-12-05 ENCOUNTER — Encounter (HOSPITAL_COMMUNITY): Payer: Self-pay | Admitting: Emergency Medicine

## 2016-12-05 ENCOUNTER — Ambulatory Visit (INDEPENDENT_AMBULATORY_CARE_PROVIDER_SITE_OTHER): Payer: Managed Care, Other (non HMO) | Admitting: Family Medicine

## 2016-12-05 VITALS — BP 108/70 | HR 98 | Temp 98.4°F | Ht 65.25 in

## 2016-12-05 DIAGNOSIS — Z79899 Other long term (current) drug therapy: Secondary | ICD-10-CM

## 2016-12-05 DIAGNOSIS — K5900 Constipation, unspecified: Secondary | ICD-10-CM | POA: Diagnosis present

## 2016-12-05 DIAGNOSIS — K1120 Sialoadenitis, unspecified: Secondary | ICD-10-CM | POA: Diagnosis present

## 2016-12-05 DIAGNOSIS — L03221 Cellulitis of neck: Secondary | ICD-10-CM | POA: Diagnosis present

## 2016-12-05 DIAGNOSIS — R5381 Other malaise: Secondary | ICD-10-CM | POA: Diagnosis not present

## 2016-12-05 DIAGNOSIS — Z923 Personal history of irradiation: Secondary | ICD-10-CM | POA: Diagnosis not present

## 2016-12-05 DIAGNOSIS — C50312 Malignant neoplasm of lower-inner quadrant of left female breast: Secondary | ICD-10-CM | POA: Diagnosis present

## 2016-12-05 DIAGNOSIS — R5081 Fever presenting with conditions classified elsewhere: Secondary | ICD-10-CM

## 2016-12-05 DIAGNOSIS — Z170 Estrogen receptor positive status [ER+]: Secondary | ICD-10-CM

## 2016-12-05 DIAGNOSIS — D0512 Intraductal carcinoma in situ of left breast: Secondary | ICD-10-CM | POA: Diagnosis not present

## 2016-12-05 DIAGNOSIS — R739 Hyperglycemia, unspecified: Secondary | ICD-10-CM | POA: Diagnosis present

## 2016-12-05 DIAGNOSIS — Z803 Family history of malignant neoplasm of breast: Secondary | ICD-10-CM | POA: Diagnosis not present

## 2016-12-05 LAB — COMPREHENSIVE METABOLIC PANEL
ALT: 37 U/L (ref 14–54)
AST: 39 U/L (ref 15–41)
Albumin: 3.8 g/dL (ref 3.5–5.0)
Alkaline Phosphatase: 63 U/L (ref 38–126)
Anion gap: 10 (ref 5–15)
BUN: 8 mg/dL (ref 6–20)
CHLORIDE: 104 mmol/L (ref 101–111)
CO2: 23 mmol/L (ref 22–32)
CREATININE: 0.66 mg/dL (ref 0.44–1.00)
Calcium: 8.8 mg/dL — ABNORMAL LOW (ref 8.9–10.3)
GFR calc Af Amer: >60 mL/min (ref >60)
GFR calc non Af Amer: >60 mL/min (ref >60)
Glucose, Bld: 126 mg/dL — ABNORMAL HIGH (ref 65–99)
Potassium: 3.7 mmol/L (ref 3.5–5.1)
SODIUM: 137 mmol/L (ref 135–145)
Total Bilirubin: 0.6 mg/dL (ref 0.3–1.2)
Total Protein: 7.1 g/dL (ref 6.5–8.1)

## 2016-12-05 LAB — CBC WITH DIFFERENTIAL/PLATELET
BASOS ABS: 0 10*3/uL (ref 0.0–0.1)
Basophils Relative: 0 %
EOS ABS: 0 10*3/uL (ref 0.0–0.7)
EOS PCT: 1 %
HCT: 38.3 % (ref 36.0–46.0)
HEMOGLOBIN: 12.9 g/dL (ref 12.0–15.0)
LYMPHS ABS: 0.5 10*3/uL — AB (ref 0.7–4.0)
Lymphocytes Relative: 11 %
MCH: 31.2 pg (ref 26.0–34.0)
MCHC: 33.7 g/dL (ref 30.0–36.0)
MCV: 92.5 fL (ref 78.0–100.0)
MONOS PCT: 12 %
Monocytes Absolute: 0.6 10*3/uL (ref 0.1–1.0)
NEUTROS PCT: 76 %
Neutro Abs: 3.6 10*3/uL (ref 1.7–7.7)
PLATELETS: 159 10*3/uL (ref 150–400)
RBC: 4.14 MIL/uL (ref 3.87–5.11)
RDW: 13 % (ref 11.5–15.5)
WBC: 4.7 10*3/uL (ref 4.0–10.5)

## 2016-12-05 LAB — I-STAT CG4 LACTIC ACID, ED: LACTIC ACID, VENOUS: 1.69 mmol/L (ref 0.5–1.9)

## 2016-12-05 MED ORDER — SODIUM CHLORIDE 0.45 % IV SOLN
INTRAVENOUS | Status: DC
  Administered 2016-12-05: 21:00:00 via INTRAVENOUS

## 2016-12-05 MED ORDER — SUCRALFATE 1 GM/10ML PO SUSP: 1.0000 g | Freq: Two times a day (BID) | ORAL | Status: DC | PRN

## 2016-12-05 MED ORDER — CLINDAMYCIN PHOSPHATE 600 MG/50ML IV SOLN: 600.0000 mg | Freq: Four times a day (QID) | INTRAVENOUS | Status: DC

## 2016-12-05 MED ORDER — DIPHENHYDRAMINE HCL 50 MG/ML IJ SOLN
50.0000 mg | Freq: Once | INTRAMUSCULAR | Status: AC
  Administered 2016-12-05: 50 mg via INTRAVENOUS
  Filled 2016-12-05: qty 1

## 2016-12-05 MED ORDER — ENOXAPARIN SODIUM 40 MG/0.4ML ~~LOC~~ SOLN
40.0000 mg | SUBCUTANEOUS | Status: DC
  Administered 2016-12-05 – 2016-12-06 (×2): 40 mg via SUBCUTANEOUS
  Filled 2016-12-05 (×2): qty 0.4

## 2016-12-05 MED ORDER — VANCOMYCIN HCL IN DEXTROSE 1-5 GM/200ML-% IV SOLN
1000.0000 mg | Freq: Three times a day (TID) | INTRAVENOUS | Status: DC
  Administered 2016-12-05 – 2016-12-07 (×5): 1000 mg via INTRAVENOUS
  Filled 2016-12-05 (×7): qty 200

## 2016-12-05 MED ORDER — DEXAMETHASONE SODIUM PHOSPHATE 10 MG/ML IJ SOLN
10.0000 mg | Freq: Once | INTRAMUSCULAR | Status: AC
  Administered 2016-12-05: 10 mg via INTRAVENOUS
  Filled 2016-12-05: qty 1

## 2016-12-05 MED ORDER — TRAMADOL HCL 50 MG PO TABS
50.0000 mg | ORAL_TABLET | Freq: Four times a day (QID) | ORAL | Status: DC | PRN
  Administered 2016-12-05: 50 mg via ORAL
  Filled 2016-12-05 (×2): qty 1

## 2016-12-05 MED ORDER — MORPHINE SULFATE (PF) 4 MG/ML IV SOLN: 4.0000 mg | INTRAVENOUS | Status: DC | PRN

## 2016-12-05 MED ORDER — KETOROLAC TROMETHAMINE 30 MG/ML IJ SOLN: 30.0000 mg | Freq: Three times a day (TID) | INTRAMUSCULAR | Status: DC | PRN

## 2016-12-05 NOTE — ED Notes (Addendum)
Pt was sent from [place] at [facility] for further management of infected parotid gland. shes had fevers, malaise, and worsening despite starting oral clindamycin yesterday. Her PCP discussed her case with ENT and recommendation was made to sent pt to ED for admission and IV antibiotics.

## 2016-12-05 NOTE — Progress Notes (Signed)
Pharmacy Antibiotic Note  Bethany Gardner is a 56 y.o. female admitted on 12/05/2016 with R-parotitis worsening after 2 days of Clindamycin.  Pharmacy has been consulted for Vancomycin dosing. WBC 4.7. Tmax 100.7. Lactic acid is within normal limits. SCr is stable at 0.66 with estimated CrCl ~85 ml/min.   Plan: Vancomycin 1000mg  IV every 8 hours.  Monitor renal function and clinical status.   Height: 5\' 6"  (167.6 cm) Weight: 180 lb (81.6 kg) IBW/kg (Calculated) : 59.3  Temp (24hrs), Avg:98.5 F (36.9 C), Min:98.4 F (36.9 C), Max:98.6 F (37 C)   Recent Labs Lab 12/04/16 1228 12/05/16 1348 12/05/16 1417  WBC 7.7 4.7  --   CREATININE 0.65 0.66  --   LATICACIDVEN  --   --  1.69    Estimated Creatinine Clearance: 85.5 mL/min (by C-G formula based on SCr of 0.66 mg/dL).    No Known Allergies  Antimicrobials this admission: Vancomycin 8/27 >>  Dose adjustments this admission:   Microbiology results: None currently  Thank you for allowing pharmacy to be a part of this patient's care.  Sloan Leiter, PharmD, BCPS Clinical Pharmacist Clinical phone 12/05/2016 until 11PM [phone] After hours, please call #[phone] 12/05/2016 3:59 PM

## 2016-12-05 NOTE — ED Triage Notes (Signed)
Pt sent to ER for admission for IV antibiotics for right lateral neck infection. States swelling is getting worse, is unable to open mouth fully, states PCP consulted with ENT for admission for IV antibiotics. Has been on clindamycin for 4 days and symptoms are getting worse. Airway intact at triage, states feels tight in her neck. A/o x4.

## 2016-12-05 NOTE — Progress Notes (Signed)
HPI:  Acute visit for parotitis. Reports started having soreness in Right side of the neck about 1 week ago, then developed redness and swelling of the neck and R side of her face about 4 days ago with fevers and malaise. Worsening over the weekend and went to the [facility] and was placed on clinda 2 days ago, went to ER yesterday and had CT scan which per report showed enlargement and enhancement of R parotid gland and stranding/inflammation in soft tissues below gland with small amount of fluid collection.  Reports was told she should be feeling better today. Is taking high dose naproxen around the clock and clinda and is feeling worse today with increased redness and pain, redness spreading down the neck, fevers overnight, malaise, nausea and fatigue - reports feels "so sick."  Reports feels worse today then yesterday and now has difficulty turning neck and opening mouth 2ndary to pain.   ROS: See pertinent positives and negatives per HPI.  Past Medical History:  Diagnosis Date  . Anemia    father has Factor V Leiden-no issues with it. Pt has not been tested  . Endometriosis   . Fibroid   . Foot pain    bilateral - occ foot pain  . Infertility, female   . 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 Obesity   . Personal history of radiation therapy 2018    Past Surgical History:  Procedure Laterality Date  . ABDOMINAL HYSTERECTOMY    . BREAST BIOPSY Left    benign  . BREAST LUMPECTOMY Left 2018  . BREAST LUMPECTOMY WITH RADIOACTIVE SEED AND SENTINEL LYMPH NODE BIOPSY Left 04/15/2016   Procedure: BREAST LUMPECTOMY WITH RADIOACTIVE SEED AND SENTINEL LYMPH NODE BIOPSY;  Surgeon: Autumn Messing III, MD;  Location: [place];  Service: General;  Laterality: Left;  BREAST LUMPECTOMY WITH RADIOACTIVE SEED AND SENTINEL LYMPH NODE BIOPSY  . DILATION AND CURETTAGE OF UTERUS    . laproscopy     three  . TONSILLECTOMY       Family History  Problem Relation Age of Onset  . Heart disease Father   . Heart attack Father 28  . Transient ischemic attack Mother   . Stroke Mother   . Breast cancer Maternal Grandmother 60  . Melanoma Sister 14  . Scleroderma Paternal Uncle 83  . Heart attack Paternal Grandfather 9  . Colon cancer Neg Hx   . Colon polyps Neg Hx   . Esophageal cancer Neg Hx   . Rectal cancer Neg Hx   . Stomach cancer Neg Hx     Social History   Social History  . Marital status: Married    Spouse name: N/A  . Number of children: N/A  . Years of education: N/A   Social History Main Topics  . Smoking status: Never Smoker  . Smokeless tobacco: Never Used  . Alcohol use No  . Drug use: No  . Sexual activity: Yes    Partners: Male    Birth control/ protection: Surgical     Comment: Hysterectomy   Other Topics Concern  . None   Social History Narrative   Work or School: homemaker      Home Situation: lives with husband and son in [place] (also has a daughter)      Spiritual Beliefs:  Christian      Lifestyle: restarting a healthy diet and exercise in 2016  Current Outpatient Prescriptions:  .  clindamycin (CLEOCIN) 300 MG capsule, Take 300 mg by mouth 3 (three) times daily., Disp: , Rfl:  .  Coenzyme Q10 (CO Q 10 PO), Take by mouth daily., Disp: , Rfl:  .  ibuprofen (ADVIL,MOTRIN) 600 MG tablet, Take 1 tablet (600 mg total) by mouth every 6 (six) hours as needed., Disp: 30 tablet, Rfl: 0 .  Nutritional Supplements (JUICE PLUS FIBRE PO), Take by mouth., Disp: , Rfl:  .  tamoxifen (NOLVADEX) 20 MG tablet, Take 1 tablet (20 mg total) by mouth daily., Disp: 30 tablet, Rfl: 12 .  TURMERIC CURCUMIN PO, Take 1 capsule by mouth daily., Disp: , Rfl:  .  VALERIAN ROOT PO, Take by mouth., Disp: , Rfl:  No current facility-administered medications for this visit.   Facility-Administered Medications Ordered in Other Visits:  .  dextrose 5 % and 0.9 % NaCl with KCl 20  mEq/L infusion, , Intravenous, Continuous, Autumn Messing III, MD .  heparin injection 5,000 Units, 5,000 Units, Subcutaneous, Q8H, Autumn Messing III, MD .  HYDROcodone-acetaminophen (NORCO/VICODIN) 5-325 MG per tablet 1-2 tablet, 1-2 tablet, Oral, Q4H PRN, Autumn Messing III, MD .  ondansetron (ZOFRAN-ODT) disintegrating tablet 4 mg, 4 mg, Oral, Q6H PRN **OR** ondansetron (ZOFRAN) 4 mg in sodium chloride 0.9 % 50 mL IVPB, 4 mg, Intravenous, Q6H PRN, Autumn Messing III, MD .  pantoprazole (PROTONIX) injection 40 mg, 40 mg, Intravenous, QHS, Autumn Messing III, MD  EXAM:  Vitals:   12/05/16 1103  BP: 108/70  Pulse: 98  Temp: 98.4 F (36.9 C)    There is no height or weight on file to calculate BMI.  GENERAL: vitals reviewed and listed above, alert, oriented, appears well hydrated and in no acute distress  HEENT: atraumatic, conjunttiva clear, no obvious abnormalities on inspection of external nose and ears, erythema and edema R side of face with TTP over parotid gland, no appreciable pus from stenson's duct but does have very tender parotid glad and difficulty opening mouth  NECK:erythema and edema R side of the neck from face to just above the shoulder with warmth  LUNGS: clear to auscultation bilaterally, no wheezes, rales or rhonchi, good air movement  CV: HRRR, no peripheral edema  MS: moves all extremities without noticeable abnormality  PSYCH: pleasant and cooperative, no obvious depression or anxiety  ASSESSMENT AND PLAN:  Discussed the following assessment and plan:  Parotitis  Fever in other diseases  Malaise  -Patient has inflammation of the parotid gland with signs and of infection on the CT with systemic symptoms of fever and malaise, difficulty opening jaw and turning head 2ndary to pain, worsening symptoms over the last 24 hours despite 48 hours of antibiotics, spreading of the redness and edema on the right side of the neck. She took high-dose NSAIDs before coming here today and  her fever currently is controlled. -Discussed case with Dr. Wilburn Cornelia, on-call for ear nose and throat and he did agree that patient needs admission for IV antibiotics as he feels this is likely bacterial rather than viral, with failure of outpatient treatment. -Assistant to contact the emergency room as a heads up. Patient reports she feels quite sick, but prefers to drive or have her husband drive to the hospital rather than to go by EMS transport.    There are no Patient Instructions on file for this visit.  Colin Benton R., DO

## 2016-12-05 NOTE — Progress Notes (Signed)
Pt called RN c/o her head itching uncontrolably  pt said that this has happened before when receiving vancomycin MD notified vancomycin slowed down to a rate of 100 and verbal orders taken for pt to receive one time dose of benadryl and Decadron will continue to monitor

## 2016-12-05 NOTE — Progress Notes (Signed)
Patient declines weight measurement today. 

## 2016-12-05 NOTE — H&P (Signed)
History and Physical    Bethany Gardner BWL:[id number] DOB: 07/20/1960 DOA: 12/05/2016  PCP: Lucretia Kern, DO Patient coming from: home  Chief Complaint: R jaw pain  HPI: Bethany Gardner is a 56 y.o. 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   Patient reports a 6 day history of right neck soreness which progressed 5 days ago to right jaw swelling. Described initially as a small lump at the jaw line. 2 days prior to admission swelling became significantly worse with associated general malaise, fevers as high as 101. Patient went to an urgent care was started on clindamycin. Symptoms continued to worsen overnight patient went to the ED the day prior to admission. Reassurance was given and patient was sent home after CT scan is obtained showing parotitis the other acute abnormality. Patient states that overnight she felt like "I was going to die. " Undated admission patient went to her primary care physician for further evaluation who sent her to the emergency room again. ENT was also consult by her primary care physician who recommended the same. Denies any dysphagia, focal neurological deficit, headache, chest pain, shortness breath, palpitations, nausea, vomiting, abdominal pain.  ED Course: Objective findings on blood  Review of Systems: As per HPI otherwise all other systems reviewed and are negative  Ambulatory Status: No restrictions  Past Medical History:  Diagnosis Date  . Anemia    father has Factor V Leiden-no issues with it. Pt has not been tested  . Endometriosis   . Fibroid   . Foot pain    bilateral - occ foot pain  . Infertility, female   . 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 Obesity   . Personal history of radiation therapy 2018    Past Surgical History:  Procedure Laterality Date  . ABDOMINAL  HYSTERECTOMY    . BREAST BIOPSY Left    benign  . BREAST LUMPECTOMY Left 2018  . BREAST LUMPECTOMY WITH RADIOACTIVE SEED AND SENTINEL LYMPH NODE BIOPSY Left 04/15/2016   Procedure: BREAST LUMPECTOMY WITH RADIOACTIVE SEED AND SENTINEL LYMPH NODE BIOPSY;  Surgeon: Autumn Messing III, MD;  Location: [place];  Service: General;  Laterality: Left;  BREAST LUMPECTOMY WITH RADIOACTIVE SEED AND SENTINEL LYMPH NODE BIOPSY  . DILATION AND CURETTAGE OF UTERUS    . laproscopy     three  . TONSILLECTOMY      Social History   Social History  . Marital status: Married    Spouse name: N/A  . Number of children: N/A  . Years of education: N/A   Occupational History  . Not on file.   Social History Main Topics  . Smoking status: Never Smoker  . Smokeless tobacco: Never Used  . Alcohol use No  . Drug use: No  . Sexual activity: Yes    Partners: Male    Birth control/ protection: Surgical     Comment: Hysterectomy   Other Topics Concern  . Not on file   Social History Narrative   Work or School: homemaker      Home Situation: lives with husband and son in [place] (also has a daughter)      Spiritual Beliefs:  Christian      Lifestyle: restarting a healthy diet and exercise in 2016          No Known Allergies  Family History  Problem Relation Age of Onset  . Heart disease Father   . Heart attack Father 3  . Transient ischemic attack Mother   . Stroke Mother   . Breast cancer Maternal Grandmother 60  . Melanoma Sister 70  . Scleroderma Paternal Uncle 6  . Heart attack Paternal Grandfather 36  . Colon cancer Neg Hx   . Colon polyps Neg Hx   . Esophageal cancer Neg Hx   . Rectal cancer Neg Hx   . Stomach cancer Neg Hx       Prior to Admission medications   Medication Sig Start Date End Date Taking? Authorizing Provider  clindamycin (CLEOCIN) 300 MG capsule Take 300 mg by mouth 3 (three) times daily. 12/03/16  Yes [provider]  Coenzyme Q10 (CO  Q 10 PO) Take by mouth daily.   Yes [provider]  ibuprofen (ADVIL,MOTRIN) 600 MG tablet Take 1 tablet (600 mg total) by mouth every 6 (six) hours as needed. 12/04/16  Yes Lacretia Leigh, MD  Nutritional Supplements (JUICE PLUS FIBRE PO) Take by mouth.   Yes [provider]  tamoxifen (NOLVADEX) 20 MG tablet Take 1 tablet (20 mg total) by mouth daily. 07/04/16  Yes Magrinat, Virgie Dad, MD  traMADol (ULTRAM) 50 MG tablet Take 50 mg by mouth as needed. 12/03/16  Yes [provider]  TURMERIC CURCUMIN PO Take 1 capsule by mouth daily.   Yes [provider]  VALERIAN ROOT PO Take by mouth.   Yes [provider]    Physical Exam: Vitals:   12/05/16 1600 12/05/16 1630 12/05/16 1700 12/05/16 1800  BP: 119/72 124/69 135/84 106/61  Pulse: 90 87 94 81  Resp:   13 16  Temp:    98.5 F (36.9 C)  TempSrc:    Oral  SpO2: 97% 97% 100% 100%  Weight:    81.6 kg (180 lb)  Height:    5\' 6"  (1.676 m)     General:  Appears calm and comfortable Eyes:  PERRL, EOMI, normal lids, iris ENT:  grossly normal hearing, lips & tongue, mmm Neck:  Definite swelling in the parotid gland region on the right with no significant surrounding lymphadenopathy. Cardiovascular:  RRR, no m/r/g. No LE edema.  Respiratory:  CTA bilaterally, no w/r/r. Normal respiratory effort. Abdomen:  soft, ntnd, NABS Skin:  Erythematous and indurated skin throughout the right jaw, preauricular region and distally down the right side of the neck Musculoskeletal:  grossly normal tone BUE/BLE, good ROM, no bony abnormality Psychiatric:  grossly normal mood and affect, speech fluent and appropriate, AOx3 Neurologic:  CN 2-12 grossly intact, moves all extremities in coordinated fashion, sensation intact  Labs on Admission: I have personally reviewed following labs and imaging studies  CBC:  Recent Labs Lab 12/04/16 1228 12/05/16 1348  WBC 7.7 4.7  NEUTROABS 6.5 3.6  HGB 12.8 12.9  HCT 37.4  38.3  MCV 92.1 92.5  PLT 180 852   Basic Metabolic Panel:  Recent Labs Lab 12/04/16 1228 12/05/16 1348  NA 140 137  K 3.9 3.7  CL 107 104  CO2 25 23  GLUCOSE 106* 126*  BUN 11 8  CREATININE 0.65 0.66  CALCIUM 9.1 8.8*   GFR: Estimated Creatinine Clearance: 85.5 mL/min (by C-G formula based on SCr of 0.66 mg/dL). Liver Function Tests:  Recent Labs Lab 12/04/16 1228 12/05/16 1348  AST 38 39  ALT 32 37  ALKPHOS 60 63  BILITOT 0.6 0.6  PROT 7.6 7.1  ALBUMIN  4.1 3.8   No results for input(s): LIPASE, AMYLASE in the last 168 hours. No results for input(s): AMMONIA in the last 168 hours. Coagulation Profile: No results for input(s): INR, PROTIME in the last 168 hours. Cardiac Enzymes: No results for input(s): CKTOTAL, CKMB, CKMBINDEX, TROPONINI in the last 168 hours. BNP (last 3 results) No results for input(s): PROBNP in the last 8760 hours. HbA1C: No results for input(s): HGBA1C in the last 72 hours. CBG: No results for input(s): GLUCAP in the last 168 hours. Lipid Profile: No results for input(s): CHOL, HDL, LDLCALC, TRIG, CHOLHDL, LDLDIRECT in the last 72 hours. Thyroid Function Tests: No results for input(s): TSH, T4TOTAL, FREET4, T3FREE, THYROIDAB in the last 72 hours. Anemia Panel: No results for input(s): VITAMINB12, FOLATE, FERRITIN, TIBC, IRON, RETICCTPCT in the last 72 hours. Urine analysis: No results found for: COLORURINE, APPEARANCEUR, LABSPEC, PHURINE, GLUCOSEU, HGBUR, BILIRUBINUR, KETONESUR, PROTEINUR, UROBILINOGEN, NITRITE, LEUKOCYTESUR  Creatinine Clearance: Estimated Creatinine Clearance: 85.5 mL/min (by C-G formula based on SCr of 0.66 mg/dL).  Sepsis Labs: @LABRCNTIP (procalcitonin:4,lacticidven:4) )No results found for this or any previous visit (from the past 240 hour(s)).   Radiological Exams on Admission: Ct Soft Tissue Neck W Contrast  Result Date: 12/04/2016 CLINICAL DATA:  Swelling and RIGHT neck and jaw for several days. The pain  has worsened. EXAM: CT NECK WITH CONTRAST TECHNIQUE: Multidetector CT imaging of the neck was performed using the standard protocol following the bolus administration of intravenous contrast. CONTRAST:  75mL ISOVUE-300 IOPAMIDOL (ISOVUE-300) INJECTION 61% COMPARISON:  Partial visualization of the salivary glands on previous CTA of the head. Ultrasound of the head neck 08/05/2016. FINDINGS: Pharynx and larynx: Normal. No mass or swelling. Salivary glands: There is asymmetric enlargement and enhancement of the RIGHT parotid gland. The LEFT parotid is normal. Submandibular glands are normal. There is superficial stranding/inflammation in the soft tissues surrounding and below the parotid gland on the RIGHT. There is a small amount of fluid anterior to the RIGHT sternocleidomastoid, but no contained fluid collection to suggest abscess. No calculi are seen. Thyroid: Multiple thyroid nodules, the largest of which is in the LEFT lobe, partially calcified, 14 mm. Management of this nodule has been addressed with the previous thyroid ultrasound. Lymph nodes: None enlarged or abnormal density. Vascular: Negative. Limited intracranial: Negative. Visualized orbits: Negative. Mastoids and visualized paranasal sinuses: Clear. Skeleton: No acute or aggressive process. Upper chest: Negative. Other: None. IMPRESSION: Asymmetric enlargement and enhancement of the RIGHT parotid gland. Surrounding inflammation. This could represent bacterial or viral parotitis. There is no evidence for obstructing stone. LEFT thyroid nodule, up to 14 mm long axis. Fine-needle aspiration of this moderately suspicious nodule should be performed based on TI-RADS criteria. Electronically Signed   By: Staci Righter M.D.   On: 12/04/2016 13:44    Assessment/Plan Active Problems:   Malignant neoplasm of lower-inner quadrant of left breast in female, estrogen receptor positive ([place])   Ductal carcinoma in situ (DCIS) of left breast   Parotitis    Hyperglycemia   Parotitis: Failed outpatient management with clindamycin 300 mg 3 times a day. CT scan as above. No focal abscess requiring drainage or appreciable mass or stone. Suspect bacterial etiology. Overlying cellulitis appreciated. Vancomycin added to IV clindamycin in the ED - DC clindamycin - Continue vancomycin - Consider DC regimen on Augmentin with addition of doxycycline if high level of concern for MRSA (less likely given gradual progression of illness). Rx recommended for 14 days w/ f/u w/ ENT if not improving.  -  Heat pad, encouraged sour candy to promote salivation - Tramadol - Toradol, Morphine prn pain due to difficlty w/ taking PO - IVF 1/2NS due to decreased ability to swallow  Hyperglycemia: likely from infectino/stress - A1c  L Breast cancer: s/p lumpectomy and radiation beads. Currently Tamoxifen on hold.  - f/u outpt.    DVT prophylaxis: levaquin  Code Status: full  Family Communication: hyusband  Disposition Plan: pending improvement on appropriate ABX regimen  Consults called: none  Admission status: inpt    Michel Eskelson J MD [facility]  If 7PM-7AM, please contact night-coverage [email or web address] Password [facility]  12/05/2016, 6:28 PM

## 2016-12-05 NOTE — ED Provider Notes (Signed)
[facility] DEPT Provider Note   CSN: [id number] Arrival date & time: 12/05/16  1304     History   Chief Complaint Chief Complaint  Patient presents with  . Neck Pain    HPI Bethany Gardner is a 56 y.o. female.  Patient with history of breast cancer status post lumpectomy, lymph node resection, radiation, currently not on Tamoxifen -- presents with continued right-sided facial swelling, redness, trismus related to right-sided parotitis. This was confirmed on CT imaging performed yesterday at [facility] emergency department. Patient was continued on clindamycin which she has taken for the past 2 days. Last dose 7 AM today. Patient awoke this morning with worsening redness and difficulty opening her mouth. She followed up with her primary care physician who spoke with ENT (Dr. Wilburn Cornelia). It was recommended that she go to the hospital for admission for IV antibiotics. She has had fevers at home to 101F. Discontinued last night. She has taken ibuprofen and tramadol for pain. She is able to swallow without difficulty. No difficulty breathing or shortness of breath. Patient does report a wound infection status post lymph node surgery. She does not know if this was MRSA. No history of diabetes. She otherwise reports generalized malaise. She has had vancomycin in the past without complication.      Past Medical History:  Diagnosis Date  . Anemia    father has Factor V Leiden-no issues with it. Pt has not been tested  . Endometriosis   . Fibroid   . Foot pain    bilateral - occ foot pain  . Infertility, female   . Malignant neoplasm of lower-inner quadrant of left female breast ([place]) 03/16/2016   left breast DCIS  . Obesity   . Personal history of radiation therapy 2018    Patient Active Problem List   Diagnosis Date Noted  . 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 Malignant neoplasm of lower-inner quadrant of left breast in female, estrogen receptor positive  ([place]) 03/16/2016  . Paresthesia 04/27/2015  . Benign paroxysmal positional vertigo 04/27/2015  . BMI 32.0-32.9,adult 09/10/2014  . Metatarsalgia of both feet 04/29/2014  . Capsulitis 04/29/2014    Past Surgical History:  Procedure Laterality Date  . ABDOMINAL HYSTERECTOMY    . BREAST BIOPSY Left    benign  . BREAST LUMPECTOMY Left 2018  . BREAST LUMPECTOMY WITH RADIOACTIVE SEED AND SENTINEL LYMPH NODE BIOPSY Left 04/15/2016   Procedure: BREAST LUMPECTOMY WITH RADIOACTIVE SEED AND SENTINEL LYMPH NODE BIOPSY;  Surgeon: Autumn Messing III, MD;  Location: [facility];  Service: General;  Laterality: Left;  BREAST LUMPECTOMY WITH RADIOACTIVE SEED AND SENTINEL LYMPH NODE BIOPSY  . DILATION AND CURETTAGE OF UTERUS    . laproscopy     three  . TONSILLECTOMY      OB History    Gravida Para Term Preterm AB Living   2 1 1   1 1    SAB TAB Ectopic Multiple Live Births   1       1       Home Medications    Prior to Admission medications   Medication Sig Start Date End Date Taking? Authorizing Provider  clindamycin (CLEOCIN) 300 MG capsule Take 300 mg by mouth 3 (three) times daily. 12/03/16   [provider]  Coenzyme Q10 (CO Q 10 PO) Take by mouth daily.    [provider]  ibuprofen (ADVIL,MOTRIN) 600 MG tablet Take 1 tablet (600 mg total) by mouth every  6 (six) hours as needed. 12/04/16   Lacretia Leigh, MD  Nutritional Supplements (JUICE PLUS FIBRE PO) Take by mouth.    [provider]  tamoxifen (NOLVADEX) 20 MG tablet Take 1 tablet (20 mg total) by mouth daily. 07/04/16   Magrinat, Virgie Dad, MD  TURMERIC CURCUMIN PO Take 1 capsule by mouth daily.    [provider]  VALERIAN ROOT PO Take by mouth.    [provider]    Family History Family History  Problem Relation Age of Onset  . Heart disease Father   . Heart attack Father 92  . Transient ischemic attack Mother   . Stroke Mother   . Breast cancer Maternal Grandmother 60    . Melanoma Sister 41  . Scleroderma Paternal Uncle 58  . Heart attack Paternal Grandfather 22  . Colon cancer Neg Hx   . Colon polyps Neg Hx   . Esophageal cancer Neg Hx   . Rectal cancer Neg Hx   . Stomach cancer Neg Hx     Social History Social History  Substance Use Topics  . Smoking status: Never Smoker  . Smokeless tobacco: Never Used  . Alcohol use No     Allergies   Patient has no known allergies.   Review of Systems Review of Systems  Constitutional: Positive for fatigue. Negative for fever.  HENT: Positive for facial swelling. Negative for rhinorrhea and sore throat.   Eyes: Negative for redness.  Respiratory: Negative for cough.   Cardiovascular: Negative for chest pain.  Gastrointestinal: Negative for abdominal pain, diarrhea, nausea and vomiting.  Genitourinary: Negative for dysuria.  Musculoskeletal: Positive for myalgias.  Skin: Positive for color change. Negative for rash.  Neurological: Positive for weakness. Negative for headaches.     Physical Exam Updated Vital Signs BP 131/82 (BP Location: Right Arm)   Pulse (!) 105   Temp 98.6 F (37 C) (Oral)   Resp 18   Ht 5\' 6"  (1.676 m)   Wt 81.6 kg (180 lb)   SpO2 100%   BMI 29.05 kg/m   Physical Exam  Constitutional: She appears well-developed and well-nourished.  HENT:  Head: Normocephalic and atraumatic.  Mouth/Throat: Oropharynx is clear and moist.  Diffuse right-sided parotid gland swelling with superficial cellulitis noted from the base of the ear extending down the neck onto the clavicle area. Minimal to moderate tenderness. Minimal trismus at time of exam. Patient in no distress.  Eyes: Conjunctivae are normal. Right eye exhibits no discharge. Left eye exhibits no discharge.  Neck: Normal range of motion. Neck supple.  Cardiovascular: Normal rate, regular rhythm and normal heart sounds.   Pulmonary/Chest: Effort normal and breath sounds normal.  Abdominal: Soft. There is no tenderness.   Lymphadenopathy:    She has cervical adenopathy.  Neurological: She is alert.  Skin: Skin is warm and dry.  Psychiatric: She has a normal mood and affect.  Nursing note and vitals reviewed.    ED Treatments / Results  Labs (all labs ordered are listed, but only abnormal results are displayed) Labs Reviewed  COMPREHENSIVE METABOLIC PANEL - Abnormal; Notable for the following:       Result Value   Glucose, Bld 126 (*)    Calcium 8.8 (*)    All other components within normal limits  CBC WITH DIFFERENTIAL/PLATELET - Abnormal; Notable for the following:    Lymphs Abs 0.5 (*)    All other components within normal limits  I-STAT CG4 LACTIC ACID, ED  Radiology Ct Soft Tissue Neck W Contrast  Result Date: 12/04/2016 CLINICAL DATA:  Swelling and RIGHT neck and jaw for several days. The pain has worsened. EXAM: CT NECK WITH CONTRAST TECHNIQUE: Multidetector CT imaging of the neck was performed using the standard protocol following the bolus administration of intravenous contrast. CONTRAST:  72mL ISOVUE-300 IOPAMIDOL (ISOVUE-300) INJECTION 61% COMPARISON:  Partial visualization of the salivary glands on previous CTA of the head. Ultrasound of the head neck 08/05/2016. FINDINGS: Pharynx and larynx: Normal. No mass or swelling. Salivary glands: There is asymmetric enlargement and enhancement of the RIGHT parotid gland. The LEFT parotid is normal. Submandibular glands are normal. There is superficial stranding/inflammation in the soft tissues surrounding and below the parotid gland on the RIGHT. There is a small amount of fluid anterior to the RIGHT sternocleidomastoid, but no contained fluid collection to suggest abscess. No calculi are seen. Thyroid: Multiple thyroid nodules, the largest of which is in the LEFT lobe, partially calcified, 14 mm. Management of this nodule has been addressed with the previous thyroid ultrasound. Lymph nodes: None enlarged or abnormal density. Vascular: Negative.  Limited intracranial: Negative. Visualized orbits: Negative. Mastoids and visualized paranasal sinuses: Clear. Skeleton: No acute or aggressive process. Upper chest: Negative. Other: None. IMPRESSION: Asymmetric enlargement and enhancement of the RIGHT parotid gland. Surrounding inflammation. This could represent bacterial or viral parotitis. There is no evidence for obstructing stone. LEFT thyroid nodule, up to 14 mm long axis. Fine-needle aspiration of this moderately suspicious nodule should be performed based on TI-RADS criteria. Electronically Signed   By: Staci Righter M.D.   On: 12/04/2016 13:44    Procedures Procedures (including critical care time)  Medications Ordered in ED Medications  clindamycin (CLEOCIN) IVPB 600 mg (not administered)  vancomycin (VANCOCIN) IVPB 1000 mg/200 mL premix (not administered)     Initial Impression / Assessment and Plan / ED Course  I have reviewed the triage vital signs and the nursing notes.  Pertinent labs & imaging results that were available during my care of the patient were reviewed by me and considered in my medical decision making (see chart for details).     Patient seen and examined. Previous work-up reviewed. Will broaden coverage with Vancomycin, continue clindamycin. Work-up reviewed. Discussed with Dr. Johnney Killian. Will admit to hospitalist service.    Vital signs reviewed and are as follows: BP 131/82 (BP Location: Right Arm)   Pulse (!) 105   Temp 98.6 F (37 C) (Oral)   Resp 18   Ht 5\' 6"  (1.676 m)   Wt 81.6 kg (180 lb)   SpO2 100%   BMI 29.05 kg/m   5:09 PM Spoke with Dr. Marily Memos earlier who will see and admit.   BP 135/84   Pulse 94   Temp 98.6 F (37 C) (Oral)   Resp 13   Ht 5\' 6"  (1.676 m)   Wt 81.6 kg (180 lb)   SpO2 100%   BMI 29.05 kg/m    Final Clinical Impressions(s) / ED Diagnoses   Final diagnoses:  Parotitis   Admit for IV abx, failure of outpatient therapy.   New Prescriptions New  Prescriptions   No medications on file     Carlisle Cater, Hershal Coria 12/05/16 1709    Charlesetta Shanks, MD 12/06/16 [phone]

## 2016-12-05 NOTE — ED Provider Notes (Signed)
Medical screening examination/treatment/procedure(s) were conducted as a shared visit with non-physician practitioner(s) and myself.  I personally evaluated the patient during the encounter.   EKG Interpretation None     Patient with right-sided facial swelling, redness and pain. Patient had been treated in outpatient basis with clindamycin but redness and swelling continued to expand. Patient developed fever to 101. Patient is referred to the emergency department for admission and IV antibiotics. He is alert and appropriate. She does have large area redness and tenderness over the right side of her jaw and lateral neck. No airway impingement. I agree with plan of management.   Charlesetta Shanks, MD 12/06/16 [phone]

## 2016-12-05 NOTE — Progress Notes (Signed)
Bethany Gardner is a 56 y.o. female patient admitted from ED awake, alert - oriented  X 4 - no acute distress noted.  VSS - Blood pressure 135/84, pulse 94, temperature 98.6 F (37 C), temperature source Oral, resp. rate 13, height 5\' 6"  (1.676 m), weight 81.6 kg (180 lb), SpO2 100 %.    IV in place, occlusive dsg intact without redness.  Orientation to room, and floor completed with information packet given to patient/family.  Patient declined safety video at this time.  Admission INP armband ID verified with patient/family, and in place.   SR up x 2, fall assessment complete, with patient and family able to verbalize understanding of risk associated with falls, and verbalized understanding to call nsg before up out of bed.  Call light within reach, patient able to voice, and demonstrate understanding.  Skin, clean-dry- intact without evidence of bruising, or skin tears.   No evidence of skin break down noted on exam.     Will cont to eval and treat per MD orders.  Luci Bank, RN 12/05/2016 5:48 PM

## 2016-12-06 DIAGNOSIS — C50312 Malignant neoplasm of lower-inner quadrant of left female breast: Secondary | ICD-10-CM

## 2016-12-06 DIAGNOSIS — Z170 Estrogen receptor positive status [ER+]: Secondary | ICD-10-CM

## 2016-12-06 LAB — HEMOGLOBIN A1C
HEMOGLOBIN A1C: 5.2 % (ref 4.8–5.6)
MEAN PLASMA GLUCOSE: 102.54 mg/dL

## 2016-12-06 LAB — HIV ANTIBODY (ROUTINE TESTING W REFLEX): HIV SCREEN 4TH GENERATION: NONREACTIVE

## 2016-12-06 MED ORDER — ACETAMINOPHEN 325 MG PO TABS
650.0000 mg | ORAL_TABLET | Freq: Four times a day (QID) | ORAL | Status: DC | PRN
  Administered 2016-12-06: 650 mg via ORAL
  Filled 2016-12-06: qty 2

## 2016-12-06 MED ORDER — SODIUM CHLORIDE 0.9 % IV SOLN: INTRAVENOUS | Status: DC

## 2016-12-06 MED ORDER — SACCHAROMYCES BOULARDII 250 MG PO CAPS
250.0000 mg | ORAL_CAPSULE | Freq: Two times a day (BID) | ORAL | Status: DC
  Administered 2016-12-06 – 2016-12-07 (×3): 250 mg via ORAL
  Filled 2016-12-06 (×3): qty 1

## 2016-12-06 MED ORDER — METRONIDAZOLE IN NACL 5-0.79 MG/ML-% IV SOLN
500.0000 mg | Freq: Three times a day (TID) | INTRAVENOUS | Status: DC
  Administered 2016-12-06 – 2016-12-07 (×3): 500 mg via INTRAVENOUS
  Filled 2016-12-06 (×3): qty 100

## 2016-12-06 MED ORDER — SENNOSIDES-DOCUSATE SODIUM 8.6-50 MG PO TABS
1.0000 | ORAL_TABLET | Freq: Two times a day (BID) | ORAL | Status: DC
  Administered 2016-12-06 (×2): 1 via ORAL
  Filled 2016-12-06 (×3): qty 1

## 2016-12-06 MED ORDER — PANTOPRAZOLE SODIUM 40 MG PO TBEC
40.0000 mg | DELAYED_RELEASE_TABLET | Freq: Every day | ORAL | Status: DC
  Administered 2016-12-06: 40 mg via ORAL
  Filled 2016-12-06 (×2): qty 1

## 2016-12-06 MED ORDER — BISACODYL 10 MG RE SUPP
10.0000 mg | Freq: Every day | RECTAL | Status: DC | PRN
  Administered 2016-12-06: 10 mg via RECTAL
  Filled 2016-12-06: qty 1

## 2016-12-06 MED ORDER — SODIUM CHLORIDE 0.9 % IV BOLUS (SEPSIS)
500.0000 mL | Freq: Once | INTRAVENOUS | Status: AC
  Administered 2016-12-06: 500 mL via INTRAVENOUS

## 2016-12-06 NOTE — Progress Notes (Addendum)
PROGRESS NOTE    Bethany Gardner  DTO:[id number] DOB: [birth year] DOA: 12/05/2016 PCP: Lucretia Kern, DO   Outpatient Specialists:     Brief Narrative:  Bethany Gardner is a 56 y.o. 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   Patient reports a 6 day history of right neck soreness which progressed 5 days ago to right jaw swelling. Described initially as a small lump at the jaw line. 2 days prior to admission swelling became significantly worse with associated general malaise, fevers as high as 101. Patient went to an urgent care was started on clindamycin. Symptoms continued to worsen overnight patient went to the ED the day prior to admission. Reassurance was given and patient was sent home after CT scan is obtained showing parotitis the other acute abnormality. Patient states that overnight she felt like "I was going to die. " Undated admission patient went to her primary care physician for further evaluation who sent her to the emergency room again. ENT was also consult by her primary care physician who recommended the same. Denies any dysphagia, focal neurological deficit, headache, chest pain, shortness breath, palpitations, nausea, vomiting, abdominal pain.   Assessment & Plan:   Active Problems:   Malignant neoplasm of lower-inner quadrant of left breast in female, estrogen receptor positive ([place])   Ductal carcinoma in situ (DCIS) of left breast   Parotitis   Hyperglycemia   Parotitis: Failed outpatient management with clindamycin 300 mg 3 times a day. CT scan as above. No focal abscess requiring drainage or appreciable mass or stone. Suspect bacterial etiology. Overlying cellulitis appreciated - vanc/flagyl - plan to d/c on Augmentin  Rx recommended for 14 days w/ f/u w/ ENT if not improving.  - Heat pad, encouraged sour candy to promote salivation - Tramadol - IVF 1/2NS due to  decreased ability to swallow  Hyperglycemia: likely from infection/stress - A1c: 5.2  L Breast cancer: s/p lumpectomy and radiation beads. Currently Tamoxifen on hold.  - f/u outpt  Constipation -bowel regimen ordered     DVT prophylaxis:  Lovenox  Code Status: Full Code   Family Communication:   Disposition Plan:     Consultants:       Subjective: Still sore but able to eat better No further fever  Objective: Vitals:   12/05/16 1800 12/06/16 0520 12/06/16 0522 12/06/16 0811  BP: 106/61 (!) 89/48 (!) 87/45 122/60  Pulse: 81 73 74 87  Resp: 16 16    Temp: 98.5 F (36.9 C) 98.4 F (36.9 C)    TempSrc: Oral Oral    SpO2: 100% 98% 97%   Weight: 81.6 kg (180 lb)     Height: 5\' 6"  (1.676 m)       Intake/Output Summary (Last 24 hours) at 12/06/16 1214 Last data filed at 12/06/16 0610  Gross per 24 hour  Intake          1083.75 ml  Output                0 ml  Net          1083.75 ml   Filed Weights   12/05/16 1340 12/05/16 1800  Weight: 81.6 kg (180 lb) 81.6 kg (180 lb)    Examination:  General exam: Appears calm and comfortable  Respiratory system: Clear to auscultation. Respiratory effort normal. Cardiovascular system: S1 & S2 heard, RRR. No JVD, murmurs, rubs, gallops or clicks. No pedal edema.  Gastrointestinal system: Abdomen is nondistended, soft and nontender. No organomegaly or masses felt. Normal bowel sounds heard. Central nervous system: Alert and oriented. No focal neurological deficits. Extremities: Symmetric 5 x 5 power. Skin: mild swelling under left jaw and in front of left ear Psychiatry: Judgement and insight appear normal. Mood & affect appropriate.     Data Reviewed: I have personally reviewed following labs and imaging studies  CBC:  Recent Labs Lab 12/04/16 1228 12/05/16 1348  WBC 7.7 4.7  NEUTROABS 6.5 3.6  HGB 12.8 12.9  HCT 37.4 38.3  MCV 92.1 92.5  PLT 180 517   Basic Metabolic Panel:  Recent Labs Lab  12/04/16 1228 12/05/16 1348  NA 140 137  K 3.9 3.7  CL 107 104  CO2 25 23  GLUCOSE 106* 126*  BUN 11 8  CREATININE 0.65 0.66  CALCIUM 9.1 8.8*   GFR: Estimated Creatinine Clearance: 85.5 mL/min (by C-G formula based on SCr of 0.66 mg/dL). Liver Function Tests:  Recent Labs Lab 12/04/16 1228 12/05/16 1348  AST 38 39  ALT 32 37  ALKPHOS 60 63  BILITOT 0.6 0.6  PROT 7.6 7.1  ALBUMIN 4.1 3.8   No results for input(s): LIPASE, AMYLASE in the last 168 hours. No results for input(s): AMMONIA in the last 168 hours. Coagulation Profile: No results for input(s): INR, PROTIME in the last 168 hours. Cardiac Enzymes: No results for input(s): CKTOTAL, CKMB, CKMBINDEX, TROPONINI in the last 168 hours. BNP (last 3 results) No results for input(s): PROBNP in the last 8760 hours. HbA1C:  Recent Labs  12/06/16 0309  HGBA1C 5.2   CBG: No results for input(s): GLUCAP in the last 168 hours. Lipid Profile: No results for input(s): CHOL, HDL, LDLCALC, TRIG, CHOLHDL, LDLDIRECT in the last 72 hours. Thyroid Function Tests: No results for input(s): TSH, T4TOTAL, FREET4, T3FREE, THYROIDAB in the last 72 hours. Anemia Panel: No results for input(s): VITAMINB12, FOLATE, FERRITIN, TIBC, IRON, RETICCTPCT in the last 72 hours. Urine analysis: No results found for: COLORURINE, APPEARANCEUR, LABSPEC, Ramos, GLUCOSEU, HGBUR, BILIRUBINUR, KETONESUR, PROTEINUR, UROBILINOGEN, NITRITE, LEUKOCYTESUR   )No results found for this or any previous visit (from the past 240 hour(s)).    Anti-infectives    Start     Dose/Rate Route Frequency Ordered Stop   12/06/16 1115  metroNIDAZOLE (FLAGYL) IVPB 500 mg     500 mg 100 mL/hr over 60 Minutes Intravenous Every 8 hours 12/06/16 1111     12/05/16 1800  clindamycin (CLEOCIN) IVPB 600 mg  Status:  Discontinued     600 mg 100 mL/hr over 30 Minutes Intravenous Every 6 hours 12/05/16 1555 12/05/16 1827   12/05/16 1630  vancomycin (VANCOCIN) IVPB 1000  mg/200 mL premix     1,000 mg 100 mL/hr over 120 Minutes Intravenous Every 8 hours 12/05/16 1605         Radiology Studies: Ct Soft Tissue Neck W Contrast  Result Date: 12/04/2016 CLINICAL DATA:  Swelling and RIGHT neck and jaw for several days. The pain has worsened. EXAM: CT NECK WITH CONTRAST TECHNIQUE: Multidetector CT imaging of the neck was performed using the standard protocol following the bolus administration of intravenous contrast. CONTRAST:  25mL ISOVUE-300 IOPAMIDOL (ISOVUE-300) INJECTION 61% COMPARISON:  Partial visualization of the salivary glands on previous CTA of the head. Ultrasound of the head neck 08/05/2016. FINDINGS: Pharynx and larynx: Normal. No mass or swelling. Salivary glands: There is asymmetric enlargement and enhancement of the RIGHT parotid gland. The LEFT parotid is normal.  Submandibular glands are normal. There is superficial stranding/inflammation in the soft tissues surrounding and below the parotid gland on the RIGHT. There is a small amount of fluid anterior to the RIGHT sternocleidomastoid, but no contained fluid collection to suggest abscess. No calculi are seen. Thyroid: Multiple thyroid nodules, the largest of which is in the LEFT lobe, partially calcified, 14 mm. Management of this nodule has been addressed with the previous thyroid ultrasound. Lymph nodes: None enlarged or abnormal density. Vascular: Negative. Limited intracranial: Negative. Visualized orbits: Negative. Mastoids and visualized paranasal sinuses: Clear. Skeleton: No acute or aggressive process. Upper chest: Negative. Other: None. IMPRESSION: Asymmetric enlargement and enhancement of the RIGHT parotid gland. Surrounding inflammation. This could represent bacterial or viral parotitis. There is no evidence for obstructing stone. LEFT thyroid nodule, up to 14 mm long axis. Fine-needle aspiration of this moderately suspicious nodule should be performed based on TI-RADS criteria. Electronically Signed    By: Staci Righter M.D.   On: 12/04/2016 13:44        Scheduled Meds: . enoxaparin (LOVENOX) injection  40 mg Subcutaneous Q24H  . pantoprazole  40 mg Oral Daily  . senna-docusate  1 tablet Oral BID   Continuous Infusions: . sodium chloride    . metronidazole    . vancomycin Stopped (12/06/16 0910)     LOS: 1 day    Time spent: 25 min    [place], DO [facility] Pager [phone]  If 7PM-7AM, please contact night-coverage [email or web address] Password [facility] 12/06/2016, 12:14 PM

## 2016-12-07 LAB — BASIC METABOLIC PANEL
Anion gap: 9 (ref 5–15)
BUN: 7 mg/dL (ref 6–20)
CALCIUM: 8.2 mg/dL — AB (ref 8.9–10.3)
CHLORIDE: 108 mmol/L (ref 101–111)
CO2: 24 mmol/L (ref 22–32)
CREATININE: 0.56 mg/dL (ref 0.44–1.00)
Glucose, Bld: 172 mg/dL — ABNORMAL HIGH (ref 65–99)
Potassium: 3.2 mmol/L — ABNORMAL LOW (ref 3.5–5.1)
SODIUM: 141 mmol/L (ref 135–145)

## 2016-12-07 MED ORDER — SACCHAROMYCES BOULARDII 250 MG PO CAPS: 250.0000 mg | ORAL_CAPSULE | Freq: Two times a day (BID) | ORAL | 0 refills | Status: DC

## 2016-12-07 MED ORDER — AMOXICILLIN-POT CLAVULANATE 875-125 MG PO TABS: 1.0000 | ORAL_TABLET | Freq: Two times a day (BID) | ORAL | Status: DC

## 2016-12-07 MED ORDER — POTASSIUM CHLORIDE CRYS ER 20 MEQ PO TBCR
40.0000 meq | EXTENDED_RELEASE_TABLET | Freq: Once | ORAL | Status: AC
  Administered 2016-12-07: 40 meq via ORAL
  Filled 2016-12-07: qty 2

## 2016-12-07 MED ORDER — AMOXICILLIN-POT CLAVULANATE 875-125 MG PO TABS
1.0000 | ORAL_TABLET | Freq: Two times a day (BID) | ORAL | Status: DC
  Administered 2016-12-07: 1 via ORAL
  Filled 2016-12-07: qty 1

## 2016-12-07 MED ORDER — AMOXICILLIN-POT CLAVULANATE 875-125 MG PO TABS: 1.0000 | ORAL_TABLET | Freq: Two times a day (BID) | ORAL | 0 refills | Status: DC

## 2016-12-07 NOTE — Discharge Summary (Signed)
Physician Discharge Summary  Bethany Gardner YKD:[id number] DOB: 19-Jun-1960 DOA: 12/05/2016  PCP: Lucretia Kern, DO  Admit date: 12/05/2016 Discharge date: 12/07/2016   Recommendations for Outpatient Follow-Up:   1. Referral to ENT if not better after abx   Discharge Diagnosis:   Active Problems:   Malignant neoplasm of lower-inner quadrant of left breast in female, estrogen receptor positive ([place])   Ductal carcinoma in situ (DCIS) of left breast   Parotitis   Hyperglycemia   Discharge disposition:  Home  Discharge Condition: Improved.  Diet recommendation: regular  Wound care: None.   History of Present Illness:   Bethany Gardner is a 56 y.o. 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   Patient reports a 6 day history of right neck soreness which progressed 5 days ago to right jaw swelling. Described initially as a small lump at the jaw line. 2 days prior to admission swelling became significantly worse with associated general malaise, fevers as high as 101. Patient went to an urgent care was started on clindamycin. Symptoms continued to worsen overnight patient went to the ED the day prior to admission. Reassurance was given and patient was sent home after CT scan is obtained showing parotitis the other acute abnormality. Patient states that overnight she felt like "I was going to die. " Undated admission patient went to her primary care physician for further evaluation who sent her to the emergency room again. ENT was also consult by her primary care physician who recommended the same. Denies any dysphagia, focal neurological deficit, headache, chest pain, shortness breath, palpitations, nausea, vomiting, abdominal pain.   Hospital Course by Problem:   Parotitis: Failed outpatient management with clindamycin 300 mg 3 times a day.  CT scan: No focal abscess requiring  drainage or appreciable mass or stone. Suspect bacterial etiology. Overlying cellulitis appreciated - vanc/flagyl- changed to PO augmentin-- Rx recommended for 14 days w/ f/u w/ ENT if not improving.  - Heat pad, encouraged sour candy to promote salivation  Hyperglycemia: likely from infection/stress - A1c: 5.2  L Breast cancer: s/p lumpectomy and radiation beads. Currently Tamoxifen on hold.  - f/u outpt  Constipation -bowel regimen ordered      Medical Consultants:    None.   Discharge Exam:   Vitals:   12/06/16 2047 12/07/16 0548  BP: 106/64 106/66  Pulse: 78 77  Resp: 18 18  Temp: 98 F (36.7 C) 98.1 F (36.7 C)  SpO2: 98% 97%   Vitals:   12/06/16 0811 12/06/16 1431 12/06/16 2047 12/07/16 0548  BP: 122/60 119/62 106/64 106/66  Pulse: 87 88 78 77  Resp:  15 18 18   Temp:  98.3 F (36.8 C) 98 F (36.7 C) 98.1 F (36.7 C)  TempSrc:  Oral Oral Oral  SpO2:  100% 98% 97%  Weight:      Height:        Gen:  NAD- patient improving, able to open jaw wider, less pain, no fever- anxious to be d/c'd   The results of significant diagnostics from this hospitalization (including imaging, microbiology, ancillary and laboratory) are listed below for reference.     Procedures and Diagnostic Studies:   No results found.   Labs:   Basic Metabolic Panel:  Recent Labs Lab 12/04/16 1228 12/05/16 1348 12/07/16 0501  NA 140 137 141  K 3.9 3.7 3.2*  CL 107 104 108  CO2 25 23 24  GLUCOSE 106* 126* 172*  BUN 11 8 7   CREATININE 0.65 0.66 0.56  CALCIUM 9.1 8.8* 8.2*   GFR Estimated Creatinine Clearance: 85.5 mL/min (by C-G formula based on SCr of 0.56 mg/dL). Liver Function Tests:  Recent Labs Lab 12/04/16 1228 12/05/16 1348  AST 38 39  ALT 32 37  ALKPHOS 60 63  BILITOT 0.6 0.6  PROT 7.6 7.1  ALBUMIN 4.1 3.8   No results for input(s): LIPASE, AMYLASE in the last 168 hours. No results for input(s): AMMONIA in the last 168 hours. Coagulation  profile No results for input(s): INR, PROTIME in the last 168 hours.  CBC:  Recent Labs Lab 12/04/16 1228 12/05/16 1348  WBC 7.7 4.7  NEUTROABS 6.5 3.6  HGB 12.8 12.9  HCT 37.4 38.3  MCV 92.1 92.5  PLT 180 159   Cardiac Enzymes: No results for input(s): CKTOTAL, CKMB, CKMBINDEX, TROPONINI in the last 168 hours. BNP: Invalid input(s): POCBNP CBG: No results for input(s): GLUCAP in the last 168 hours. D-Dimer No results for input(s): DDIMER in the last 72 hours. Hgb A1c  Recent Labs  12/06/16 0309  HGBA1C 5.2   Lipid Profile No results for input(s): CHOL, HDL, LDLCALC, TRIG, CHOLHDL, LDLDIRECT in the last 72 hours. Thyroid function studies No results for input(s): TSH, T4TOTAL, T3FREE, THYROIDAB in the last 72 hours.  Invalid input(s): FREET3 Anemia work up No results for input(s): VITAMINB12, FOLATE, FERRITIN, TIBC, IRON, RETICCTPCT in the last 72 hours. Microbiology No results found for this or any previous visit (from the past 240 hour(s)).   Discharge Instructions:   Discharge Instructions    Diet general    Complete by:  As directed    Increase activity slowly    Complete by:  As directed      Allergies as of 12/07/2016   No Known Allergies     Medication List    STOP taking these medications   clindamycin 300 MG capsule Commonly known as:  CLEOCIN   tamoxifen 20 MG tablet Commonly known as:  NOLVADEX     TAKE these medications   amoxicillin-clavulanate 875-125 MG tablet Commonly known as:  AUGMENTIN Take 1 tablet by mouth every 12 (twelve) hours.   CO Q 10 PO Take by mouth daily.   ibuprofen 600 MG tablet Commonly known as:  ADVIL,MOTRIN Take 1 tablet (600 mg total) by mouth every 6 (six) hours as needed.   JUICE PLUS FIBRE PO Take by mouth.   saccharomyces boulardii 250 MG capsule Commonly known as:  FLORASTOR Take 1 capsule (250 mg total) by mouth 2 (two) times daily.   traMADol 50 MG tablet Commonly known as:  ULTRAM Take 50  mg by mouth as needed.   TURMERIC CURCUMIN PO Take 1 capsule by mouth daily.   VALERIAN ROOT PO Take by mouth.            Discharge Care Instructions        Start     Ordered   12/07/16 0000  amoxicillin-clavulanate (AUGMENTIN) 875-125 MG tablet  Every 12 hours     12/07/16 0823   12/07/16 0000  saccharomyces boulardii (FLORASTOR) 250 MG capsule  2 times daily     12/07/16 0823   12/07/16 0000  Increase activity slowly     12/07/16 0823   12/07/16 0000  Diet general     12/07/16 0823        Time coordinating discharge: 35 min  Signed:  Tascha Casares U Lotus Santillo  [facility] 12/07/2016, 8:23 AM

## 2016-12-07 NOTE — Progress Notes (Signed)
Jodi Geralds to be D/C'd Home per MD order.  Discussed with the patient and all questions fully answered.  VSS, Skin clean, dry and intact without evidence of skin break down, no evidence of skin tears noted. IV catheter discontinued intact. Site without signs and symptoms of complications. Dressing and pressure applied.  An After Visit Summary was printed and given to the patient. Patient received prescription.  D/c education completed with patient/family including follow up instructions, medication list, d/c activities limitations if indicated, with other d/c instructions as indicated by MD - patient able to verbalize understanding, all questions fully answered.   Patient instructed to return to ED, call 911, or call MD for any changes in condition.   Patient escorted via [place], and D/C home via private auto.  Luci Bank 12/07/2016 9:10 AM

## 2016-12-07 NOTE — Discharge Instructions (Signed)
Parotitis Parotitis means that you have irritation and swelling (inflammation) in one or both of your parotid glands. These glands make spit (saliva). They are found on each side of your face, below and in front of your earlobes. You may or may not have pain with this condition. Follow these instructions at home: Medicines  Take over-the-counter and prescription medicines only as told by your doctor.  If you were prescribed an antibiotic medicine, take it as told by your doctor. Do not stop taking the antibiotic even if you start to feel better. Managing pain and swelling  Apply warm cloths (compresses) to the swollen area as told by your doctor.  Gently rub your parotid glands as told by your doctor. General instructions   Drink enough fluid to keep your pee (urine) clear or pale yellow.  Suck on sour candy. This may help: ? To make your mouth less dry. ? To make more spit.  Keep your mouth clean and moist. ? Gargle with a salt-water mixture 3-4 times per day, or as needed. ? To make a salt-water mixture, stir -1 tsp of salt into 1 cup of warm water.  Take good care of your mouth: ? Brush your teeth at least two times per day. ? Floss your teeth every day. ? See your dentist regularly.  Do not use tobacco products. These include cigarettes, chewing tobacco, or e-cigarettes. If you need help quitting,  ask your doctor.  Keep all follow-up visits as told by your doctor. This is important. Contact a doctor if:  You have a fever or chills.  You have new symptoms.  Your symptoms get worse.  Your symptoms do not get better with treatment. This information is not intended to replace advice given to you by your health care provider. Make sure you discuss any questions you have with your health care provider. Document Released: 04/30/2010 Document Revised: 09/03/2015 Document Reviewed: 08/21/2014 Elsevier Interactive Patient Education  2018 [employer]  

## 2016-12-08 ENCOUNTER — Telehealth: Payer: Self-pay | Admitting: Family Medicine

## 2016-12-08 DIAGNOSIS — K1120 Sialoadenitis, unspecified: Secondary | ICD-10-CM

## 2016-12-08 NOTE — Telephone Encounter (Signed)
° ° ° °  Pt call to say she would like to speak with Dr Maudie Mercury about her visit at the hospital  Pt said she is having a lot of drainage in the throat and is wondering if it is  Drainage from the swelling in her neck. She would like a call back   [phone]

## 2016-12-08 NOTE — Telephone Encounter (Signed)
I called the pt and informed her of the message below.  Patient stated she is on her way to [place] and would like to be referred to an ENT.  She is aware the referral was placed and someone will call with appt info.

## 2016-12-08 NOTE — Telephone Encounter (Signed)
Can you call her back today? I am not sure if this is related or not. I think if symptoms persist should see ENT. If worsening, fevers, etc should return to hospital.

## 2016-12-09 ENCOUNTER — Telehealth: Payer: Self-pay

## 2016-12-09 NOTE — Telephone Encounter (Signed)
Attempted to reach patient to complete TCM follow-up call, no answer and voicemail is full. Will try to contact again later.

## 2016-12-13 NOTE — Telephone Encounter (Signed)
Attempted to reach patient to complete TCM follow-up call. Unable to reach patient. Message left on voicemail asking patient to return call to office.   

## 2016-12-14 NOTE — Telephone Encounter (Signed)
Transition Care Management Follow-up Telephone Call  Admit date: 12/05/2016 Discharge date: 12/07/2016   Recommendations for Outpatient Follow-Up:   1. Referral to ENT if not better after abx   Discharge Diagnosis:   Active Problems:   Malignant neoplasm of lower-inner quadrant of left breast in female, estrogen receptor positive ([place])   Ductal carcinoma in situ (DCIS) of left breast   Parotitis   Hyperglycemia   Discharge disposition:  Home  Discharge Condition: Improved.  Diet recommendation: regular  Wound care: None.  --   How have you been since you were released from the hospital? "I have been okay. I've been fine actually."   Do you understand why you were in the hospital? yes   Do you understand the discharge instructions? yes   Where were you discharged to? Home   Items Reviewed:  Medications reviewed: yes  Allergies reviewed: yes  Dietary changes reviewed: no  Referrals reviewed: yes, pending ENT referral   Functional Questionnaire:   Activities of Daily Living (ADLs):   She states they are independent in the following: ambulation, bathing and hygiene, feeding, continence, grooming, toileting and dressing States they require assistance with the following: None   Any transportation issues/concerns?: no   Any patient concerns? Yes, would like update on referral status. She accidentally threw away her hospital AVS. I explained how to access via MyChart as well as put a copy in the mail for her.   Confirmed importance and date/time of follow-up visits scheduled no, patient declined to schedule PCP follow-up at this time. She would like to see ENT first.  Confirmed with patient if condition begins to worsen call PCP or go to the ER.  Patient was given the office number and encouraged to call back with question or concerns.  : yes

## 2016-12-14 NOTE — Telephone Encounter (Signed)
Spoke w/ patient for TCM. She would like an update regarding referral status. Please contact her when you have chance.

## 2016-12-20 ENCOUNTER — Other Ambulatory Visit: Payer: Self-pay | Admitting: Internal Medicine

## 2016-12-20 DIAGNOSIS — E042 Nontoxic multinodular goiter: Secondary | ICD-10-CM

## 2016-12-21 ENCOUNTER — Ambulatory Visit
Admission: RE | Admit: 2016-12-21 | Discharge: 2016-12-21 | Disposition: A | Discharge status: Home / Self Care | Payer: Managed Care, Other (non HMO) | Source: Ambulatory Visit | Attending: Internal Medicine | Admitting: Internal Medicine

## 2016-12-21 DIAGNOSIS — E042 Nontoxic multinodular goiter: Secondary | ICD-10-CM

## 2016-12-29 ENCOUNTER — Encounter: Payer: Self-pay | Admitting: Family Medicine

## 2017-01-04 ENCOUNTER — Other Ambulatory Visit: Payer: Self-pay | Admitting: Internal Medicine

## 2017-01-04 DIAGNOSIS — E042 Nontoxic multinodular goiter: Secondary | ICD-10-CM

## 2017-01-17 ENCOUNTER — Other Ambulatory Visit (HOSPITAL_COMMUNITY)
Admission: RE | Admit: 2017-01-17 | Discharge: 2017-01-17 | Disposition: A | Discharge status: Home / Self Care | Payer: Managed Care, Other (non HMO) | Source: Ambulatory Visit | Attending: Interventional Radiology | Admitting: Interventional Radiology

## 2017-01-17 ENCOUNTER — Telehealth: Payer: Self-pay | Admitting: Oncology

## 2017-01-17 ENCOUNTER — Ambulatory Visit
Admission: RE | Admit: 2017-01-17 | Discharge: 2017-01-17 | Disposition: A | Discharge status: Home / Self Care | Payer: Managed Care, Other (non HMO) | Source: Ambulatory Visit | Attending: Internal Medicine | Admitting: Internal Medicine

## 2017-01-17 DIAGNOSIS — E042 Nontoxic multinodular goiter: Secondary | ICD-10-CM

## 2017-01-17 NOTE — Telephone Encounter (Signed)
Scheduled appt per 10/9 sch message - patient is aware of appt date and time.  

## 2017-01-19 ENCOUNTER — Telehealth: Payer: Self-pay | Admitting: *Deleted

## 2017-01-19 NOTE — Telephone Encounter (Signed)
As requested by Dr. Vernard Gambles, the patient was contacted as follows: "Biopsy was adequate and benign and does not need additional Afirma testing."/vm

## 2017-01-24 ENCOUNTER — Telehealth: Payer: Self-pay

## 2017-01-24 ENCOUNTER — Encounter: Payer: Self-pay | Admitting: Adult Health

## 2017-01-24 ENCOUNTER — Ambulatory Visit (HOSPITAL_BASED_OUTPATIENT_CLINIC_OR_DEPARTMENT_OTHER): Payer: Managed Care, Other (non HMO) | Admitting: Adult Health

## 2017-01-24 VITALS — BP 124/82 | HR 78 | Temp 97.7°F | Resp 20 | Ht 66.0 in | Wt 193.5 lb

## 2017-01-24 DIAGNOSIS — C50312 Malignant neoplasm of lower-inner quadrant of left female breast: Secondary | ICD-10-CM | POA: Diagnosis not present

## 2017-01-24 DIAGNOSIS — N898 Other specified noninflammatory disorders of vagina: Secondary | ICD-10-CM | POA: Diagnosis not present

## 2017-01-24 DIAGNOSIS — Z780 Asymptomatic menopausal state: Secondary | ICD-10-CM

## 2017-01-24 DIAGNOSIS — R61 Generalized hyperhidrosis: Secondary | ICD-10-CM

## 2017-01-24 DIAGNOSIS — Z170 Estrogen receptor positive status [ER+]: Secondary | ICD-10-CM | POA: Diagnosis not present

## 2017-01-24 DIAGNOSIS — N951 Menopausal and female climacteric states: Secondary | ICD-10-CM | POA: Diagnosis not present

## 2017-01-24 DIAGNOSIS — E2839 Other primary ovarian failure: Secondary | ICD-10-CM

## 2017-01-24 MED ORDER — ANASTROZOLE 1 MG PO TABS: 1.0000 mg | ORAL_TABLET | Freq: Every day | ORAL | 0 refills | Status: DC

## 2017-01-24 NOTE — Telephone Encounter (Signed)
Pt called that the medication [facility] filled today was supposed to be 90 day supply and there was only 54.   lvm that figured it was anastrozole pt was calling about and [facility] did write for 90 tabs.   Called [employer] and they said insurance would not pay for #90. Pt would have to call insurance and see what pharmacy she can use to get #90, maybe mail order.   Called pt back with this information

## 2017-01-24 NOTE — Progress Notes (Signed)
Bethany Gardner  [phone] Fax:(336) [phone]     ID: Bethany Gardner DOB: 1961-02-20  MR#: [id number]  CBJ#:[id number]  Patient Care Team: Bethany Kern, DO as PCP - General (Family Medicine) Jovita Kussmaul, MD as Consulting Physician (General Surgery) Magrinat, Virgie Dad, MD as Consulting Physician (Oncology) Kyung Rudd, MD as Consulting Physician (Radiation Oncology) Salvadore Dom, MD as Consulting Physician (Obstetrics and Gynecology) Delice Bison, Charlestine Massed, NP as Nurse Practitioner (Hematology and Oncology) Scot Dock, NP OTHER MD:  CHIEF COMPLAINT: Ductal carcinoma in situ  CURRENT TREATMENT: Anastrozole   BREAST CANCER HISTORY: From the original consult note:  Bethany Gardner had screening mammography with tomography at the [facility] 03/08/2016 showing suspicious calcifications in the left breast. Left diagnostic mammography with tomography and left breast ultrasonography 03/14/2016 found the breast density to be category B. In the lower inner quadrant of the left breast there were calcifications measuring 2.1 cm, in a linear arrangement. There was also an area of asymmetry but no palpable abnormality on exam. Ultrasonography of the breast was noninformative an ultrasound of the breast was mammographically benign.  Biopsy of the area of calcifications 03/14/2016 showed ([id number]) ductal carcinoma in situ, grade 3, involving a complex sclerosing lesion. Some areas were suspicious but not diagnostic for invasion. This was estrogen receptor positive at 60% with moderate staining, but progesterone receptor negative.  Her subsequent history is as detailed below  INTERVAL HISTORY: Bethany Gardner returns today for follow-up and treatment of her estrogen receptor positive breast cancer. She saw Dr. Jana Hakim in 11/2016 and she noted difficulty sleeping.  She has stopped the tamoxifen.  She is sleeping better since stopping the Tamoxifen.   REVIEW OF  SYSTEMS: Bethany Gardner is about to move to [place] and she is potentially going to be without insurance for a month or two, therefore she is here today to go ahead and discuss her sleeping issue above.  She also notes her hot flashes and night sweats are improved.  She underwent lab testing with [place], LH and estradiol which were consistent with post menopause.  She is also having vaginal dryness and has looked into [place] lisa touch.  A detailed ROS is otherwise non contributory.    PAST MEDICAL HISTORY: Past Medical History:  Diagnosis Date  . Anemia    father has Factor V Leiden-no issues with it. Pt has not been tested  . Endometriosis   . Fibroid   . Foot pain    bilateral - occ foot pain  . Infertility, female   . Malignant neoplasm of lower-inner quadrant of left female breast ([place]) 03/16/2016   left breast DCIS  . Obesity   . Personal history of radiation therapy 2018    PAST SURGICAL HISTORY: Past Surgical History:  Procedure Laterality Date  . ABDOMINAL HYSTERECTOMY    . BREAST BIOPSY Left    benign  . BREAST LUMPECTOMY Left 2018  . BREAST LUMPECTOMY WITH RADIOACTIVE SEED AND SENTINEL LYMPH NODE BIOPSY Left 04/15/2016   Procedure: BREAST LUMPECTOMY WITH RADIOACTIVE SEED AND SENTINEL LYMPH NODE BIOPSY;  Surgeon: Autumn Messing III, MD;  Location: [facility];  Service: General;  Laterality: Left;  BREAST LUMPECTOMY WITH RADIOACTIVE SEED AND SENTINEL LYMPH NODE BIOPSY  . DILATION AND CURETTAGE OF UTERUS    . laproscopy     three  . TONSILLECTOMY      FAMILY HISTORY Family History  Problem Relation Age of Onset  . Heart disease Father   . Heart attack  Father 82  . Transient ischemic attack Mother   . Stroke Mother   . Breast cancer Maternal Grandmother 60  . Melanoma Sister 19  . Scleroderma Paternal Uncle 13  . Heart attack Paternal Grandfather 42  . Colon cancer Neg Hx   . Colon polyps Neg Hx   . Esophageal cancer Neg Hx   . Rectal cancer Neg Hx   . Stomach  cancer Neg Hx   The patient's parents are living, in their late 5s as of December 2017. The patient has one brother, 2 sisters. The patient's sister was diagnosed with melanoma at age 12. There is no history of breast or ovarian cancer in the family.   GYNECOLOGIC HISTORY:  No LMP recorded. Patient has had a hysterectomy. Menarche age 61, first live birth age 32. The patient is GX P1. She underwent hysterectomy with no salpingo-oophorectomy in 2010. She did not take hormone replacement. 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 They [place] here [place] the [facility] area in 2016. The patient's daughter patent is an Statistician in [place]. The patient's adopted son Bethany Gardner lives at home with the patient.     ADVANCED DIRECTIVES: not in place   HEALTH MAINTENANCE: Social History  Substance Use Topics  . Smoking status: Never Smoker  . Smokeless tobacco: Never Used  . Alcohol use No     Colonoscopy:October 2016, [facility]  PAP:  Bone density:   No Known Allergies  Current Outpatient Prescriptions  Medication Sig Dispense Refill  . Coenzyme Q10 (CO Q 10 PO) Take by mouth daily.    Marland Kitchen ibuprofen (ADVIL,MOTRIN) 600 MG tablet Take 1 tablet (600 mg total) by mouth every 6 (six) hours as needed. 30 tablet 0  . Nutritional Supplements (JUICE PLUS FIBRE PO) Take by mouth.    . TURMERIC CURCUMIN PO Take 1 capsule by mouth daily.    Marland Kitchen VALERIAN ROOT PO Take by mouth.     No current facility-administered medications for this visit.    Facility-Administered Medications Ordered in Other Visits  Medication Dose Route Frequency Provider Last Rate Last Dose  . dextrose 5 % and 0.9 % NaCl with KCl 20 mEq/L infusion   Intravenous Continuous Autumn Messing III, MD      . heparin injection 5,000 Units   5,000 Units Subcutaneous Q8H Autumn Messing III, MD      . HYDROcodone-acetaminophen (NORCO/VICODIN) 5-325 MG per tablet 1-2 tablet  1-2 tablet Oral Q4H PRN Autumn Messing III, MD      . ondansetron (ZOFRAN-ODT) disintegrating tablet 4 mg  4 mg Oral Q6H PRN Autumn Messing III, MD       Or  . ondansetron (ZOFRAN) 4 mg in sodium chloride 0.9 % 50 mL IVPB  4 mg Intravenous Q6H PRN Autumn Messing III, MD      . pantoprazole (PROTONIX) injection 40 mg  40 mg Intravenous QHS Autumn Messing III, MD        OBJECTIVE:    Vitals:   01/24/17 1313  BP: 124/82  Pulse: 78  Resp: 20  Temp: 97.7 F (36.5 C)  SpO2: 100%     Body mass index is 31.23 kg/m.    ECOG FS:0 - Asymptomatic GENERAL: Patient is a well appearing female in no acute distress HEENT:  Sclerae anicteric.  Oropharynx clear and moist. No ulcerations or evidence of oropharyngeal candidiasis. Neck is supple.  NODES:  No cervical, supraclavicular, or axillary lymphadenopathy palpated.  BREAST EXAM:  Deferred. LUNGS:  Clear to auscultation bilaterally.  No wheezes or rhonchi. HEART:  Regular rate and rhythm. No murmur appreciated. ABDOMEN:  Soft, nontender.  Positive, normoactive bowel sounds. No organomegaly palpated. MSK:  No focal spinal tenderness to palpation. Full range of motion bilaterally in the upper extremities. EXTREMITIES:  No peripheral edema.   SKIN:  Clear with no obvious rashes or skin changes. No nail dyscrasia. NEURO:  Nonfocal. Well oriented.  Appropriate affect.     LAB RESULTS:  CMP     Component Value Date/Time   NA 141 12/07/2016 0501   NA 141 03/23/2016 1240   K 3.2 (L) 12/07/2016 0501   K 4.7 03/23/2016 1240   CL 108 12/07/2016 0501   CO2 24 12/07/2016 0501   CO2 25 03/23/2016 1240   GLUCOSE 172 (H) 12/07/2016 0501   GLUCOSE 97 03/23/2016 1240   BUN 7 12/07/2016 0501   BUN 13.0 03/23/2016 1240   CREATININE 0.56 12/07/2016 0501   CREATININE 0.7 03/23/2016 1240   CALCIUM 8.2 (L) 12/07/2016 0501   CALCIUM 9.6  03/23/2016 1240   PROT 7.1 12/05/2016 1348   PROT 7.8 03/23/2016 1240   ALBUMIN 3.8 12/05/2016 1348   ALBUMIN 4.0 03/23/2016 1240   AST 39 12/05/2016 1348   AST 21 03/23/2016 1240   ALT 37 12/05/2016 1348   ALT 20 03/23/2016 1240   ALKPHOS 63 12/05/2016 1348   ALKPHOS 79 03/23/2016 1240   BILITOT 0.6 12/05/2016 1348   BILITOT 0.42 03/23/2016 1240   GFRNONAA >60 12/07/2016 0501   GFRAA >60 12/07/2016 0501    INo results found for: SPEP, UPEP  Lab Results  Component Value Date   WBC 4.7 12/05/2016   NEUTROABS 3.6 12/05/2016   HGB 12.9 12/05/2016   HCT 38.3 12/05/2016   MCV 92.5 12/05/2016   PLT 159 12/05/2016      Chemistry      Component Value Date/Time   NA 141 12/07/2016 0501   NA 141 03/23/2016 1240   K 3.2 (L) 12/07/2016 0501   K 4.7 03/23/2016 1240   CL 108 12/07/2016 0501   CO2 24 12/07/2016 0501   CO2 25 03/23/2016 1240   BUN 7 12/07/2016 0501   BUN 13.0 03/23/2016 1240   CREATININE 0.56 12/07/2016 0501   CREATININE 0.7 03/23/2016 1240      Component Value Date/Time   CALCIUM 8.2 (L) 12/07/2016 0501   CALCIUM 9.6 03/23/2016 1240   ALKPHOS 63 12/05/2016 1348   ALKPHOS 79 03/23/2016 1240   AST 39 12/05/2016 1348   AST 21 03/23/2016 1240   ALT 37 12/05/2016 1348   ALT 20 03/23/2016 1240   BILITOT 0.6 12/05/2016 1348   BILITOT 0.42 03/23/2016 1240       No results found for: LABCA2  No components found for: LABCA125  No results for input(s): INR in the last 168 hours.  Urinalysis No results found for: COLORURINE, APPEARANCEUR, LABSPEC, PHURINE, GLUCOSEU, HGBUR, BILIRUBINUR, KETONESUR, PROTEINUR, UROBILINOGEN, NITRITE, LEUKOCYTESUR   STUDIES: US Thyroid Fna Each Nodule  Result Date: 01/18/2017 INDICATION: Indeterminate thyroid nodule.  Previous nondiagnostic biopsy. EXAM: ULTRASOUND GUIDED FINE NEEDLE ASPIRATION OF INDETERMINATE THYROID NODULE COMPARISON:  12/21/2016 MEDICATIONS: Lidocaine 1% subcutaneous COMPLICATIONS: None immediate.  TECHNIQUE: Informed written consent was obtained from the patient after a discussion of the risks, benefits and alternatives  to treatment. Questions regarding the procedure were encouraged and answered. A timeout was performed prior to the initiation of the procedure. Pre-procedural ultrasound scanning demonstrated unchanged size and appearance of the indeterminate nodule within the mid left lobe. The procedure was planned. The neck was prepped in the usual sterile fashion, and a sterile drape was applied covering the operative field. A timeout was performed prior to the initiation of the procedure. Local anesthesia was provided with 1% lidocaine. Under direct ultrasound guidance, 5 FNA biopsies were performed of the mid left nodule with a 25 gauge needle. Multiple ultrasound images were saved for procedural documentation purposes. The samples were prepared and submitted to pathology, and [facility]. Limited post procedural scanning was negative for hematoma or additional complication. Dressings were placed. The patient tolerated the above procedures procedure well without immediate postprocedural complication. FINDINGS: Nodule reference number based on prior diagnostic ultrasound: 1 Maximum size: 1.5 cm Location: Left; Mid ACR TI-RADS risk category: TR4 (4-6 points) Reason for biopsy: indeterminate (FLUS, AUS, Follicular neoplasm) on prior biopsy Ultrasound imaging confirms appropriate placement of the needles within the thyroid nodule. IMPRESSION: Technically successful ultrasound guided fine needle aspiration of mid left nodule. Electronically Signed   By: Lucrezia Gardner M.D.   On: 01/18/2017 10:13    ELIGIBLE FOR AVAILABLE RESEARCH PROTOCOL:  no  ASSESSMENT: 56 y.o. [place] woman status post left breast lower inner quadrant biopsy 03/14/2016 for ductal carcinoma in situ, grade 3, estrogen receptor positive, progesterone receptor negative  (1) breast conserving surgery with sentinel lymph node  sampling planned  (2) adjuvant radiation to be completed 07/09/2015  (3)  Tamoxifen started 03/24/2016 neoadjuvantly, held during radiation, resumed April 2017, stopped in July, 2018 due to sleep issues, Anastrozole started 01/2017  (4) the patient qualifies for genetics testing, but declined (see note 07/12/2016  PLAN: I reviewed side effects of Anastrozole for Bethany Gardner.  She was recommended to try taking Anastrozole.  She is post menopausal based on labs we acquired in 10/2015.  She and I discussed this in detail and she will start Anastrozole.  I will order a bone density.  She will get this done soon. We reviewed her vaginal dryness.  She is going to reconsider [employer]. She declined referral to PT today.  She will be moving to [place], so we will see her on a PRN basis.    A total of (30) minutes of face-to-face time was spent with this patient with greater than 50% of that time in counseling and care-coordination.  Scot Dock, NP   01/24/2017 1:25 PM Medical Oncology and Hematology [facility] [address] [place], [place] [address] Tel. [phone]    Fax. [phone]

## 2017-01-31 ENCOUNTER — Other Ambulatory Visit: Payer: Managed Care, Other (non HMO)

## 2017-02-13 ENCOUNTER — Ambulatory Visit
Admission: RE | Admit: 2017-02-13 | Discharge: 2017-02-13 | Disposition: A | Discharge status: Home / Self Care | Payer: Managed Care, Other (non HMO) | Source: Ambulatory Visit | Attending: Adult Health | Admitting: Adult Health

## 2017-02-13 ENCOUNTER — Telehealth: Payer: Self-pay

## 2017-02-13 ENCOUNTER — Inpatient Hospital Stay: Admission: RE | Admit: 2017-02-13 | Payer: Managed Care, Other (non HMO) | Source: Ambulatory Visit

## 2017-02-13 DIAGNOSIS — E2839 Other primary ovarian failure: Secondary | ICD-10-CM

## 2017-02-13 NOTE — Telephone Encounter (Signed)
Patient returned call.  LPN informed her that bone density results were normal.  She was pleased.  She said she was nervous about what results would be.

## 2017-02-13 NOTE — Telephone Encounter (Signed)
-----   Message from Gardenia Phlegm, NP sent at 02/13/2017  9:30 AM EST ----- Please call patient and let her know that her results are NORMAL.    Thanks,  Duson ----- Message ----- From: Interface, Rad Results In Sent: 02/13/2017   9:23 AM To: Gardenia Phlegm, NP

## 2017-02-13 NOTE — Telephone Encounter (Signed)
LPN left message for patient to call back.

## 2017-02-14 ENCOUNTER — Telehealth: Payer: Self-pay

## 2017-02-14 ENCOUNTER — Ambulatory Visit: Payer: Managed Care, Other (non HMO) | Admitting: Oncology

## 2017-02-14 NOTE — Telephone Encounter (Signed)
Pt called for anastrozole refill- she had a partial fill and should have more on file at pharmacy.   She has been on anastrozole for 3 weeks.  The only possible side effect is pain on her lumpectomy side, the breast is tender and the ribs is sore there as well. Like someone kicked her. 8/10 at that time. Now about 5/10.  On the under side of breast. She has used an advil every now and then, it helped maybe a little.  This pain started about 10 days ago. No redness, no swelling, no known injury or overuse,

## 2017-02-14 NOTE — Telephone Encounter (Signed)
This RN spoke with pt per call - discussed with her onset of arthralgia in a compromised area may be related to Anastrozole.   Discussed above and if symptoms worsen or change as discussed by TRIAGE nurse she should call.  Favour verbalized understanding- no further needs at this time.

## 2017-02-20 ENCOUNTER — Other Ambulatory Visit: Payer: Self-pay

## 2017-03-17 ENCOUNTER — Ambulatory Visit: Payer: Managed Care, Other (non HMO) | Admitting: Family Medicine

## 2017-03-17 ENCOUNTER — Ambulatory Visit: Payer: Self-pay | Admitting: *Deleted

## 2017-03-17 ENCOUNTER — Telehealth: Payer: Self-pay | Admitting: Emergency Medicine

## 2017-03-17 ENCOUNTER — Encounter: Payer: Self-pay | Admitting: Family Medicine

## 2017-03-17 VITALS — BP 120/82 | HR 88 | Temp 98.3°F

## 2017-03-17 DIAGNOSIS — J690 Pneumonitis due to inhalation of food and vomit: Secondary | ICD-10-CM

## 2017-03-17 NOTE — Patient Instructions (Addendum)
Pneumonitis  Pneumonitis is inflammation of the lungs. Infection or exposure to certain substances or allergens can cause this condition. Allergens are substances that you are allergic to.  What are the causes?  This condition may be caused by:  · An infection from bacteria or a virus (pneumonia).  · Exposure to certain substances in the workplace. This includes working on farms and in certain industries. Some substances that can cause this condition include asbestos, silica, inhaled acids, or inhaled chlorine gas.  · Repeated exposure to bird feathers, bird feces, or other allergens.  · Medicines such as chemotherapy drugs, certain antibiotic medicines, and some heart medicines.  · Radiation therapy.  · Exposure to mold. A hot tub, sauna, or home humidifier can have mold growing in it, even if it looks clean. You can breathe in the mold through water vapor.  · Breathing in (aspirating) stomach contents, food, or liquids into the lungs.    What are the signs or symptoms?  Symptoms of this condition include:  · Shortness of breath or trouble breathing. This is the most common symptom.  · Cough.  · Fever.  · Decreased energy.  · Decreased appetite.    How is this diagnosed?  This condition may be diagnosed based on:  · Your medical history.  · Physical exam.  · Blood tests.  · Other tests, including:  ? Pulmonary function test (PFT). This measures how well your lungs work.  ? Chest X-ray.  ? CT scan of the lungs.  ? Bronchoscopy. In this procedure, your health care provider looks at your airways through an instrument called a bronchoscope.  ? Lung biopsy. In this procedure, your health care provider takes a small piece of tissue from your lungs to examine it.    How is this treated?  Treatment depends on the cause of the condition. If the cause is exposure to a substance, avoiding further exposure to that substance will help reduce your symptoms. Possible medical treatments for pneumonitis include:  · Corticosteroid  medicine to help decrease inflammation.  · Antibiotic medicine to help fight an infection caused by bacteria.  · Bronchodilators or inhalers to help relax the muscles and make breathing easier.  · Oxygen therapy, if you are having trouble breathing.    Follow these instructions at home:  · Take or use over-the-counter and prescription medicines only as told by your health care provider. This includes any inhaler use.  · Avoid exposure to any substance that caused your pneumonitis. If you need to work with substances that can cause pneumonitis, wear a mask to protect your lungs.  · If you were prescribed an antibiotic, take it as told by your health care provider. Do not stop taking the antibiotic even if you start to feel better.  · If you were prescribed an inhaler, keep it with you at all times.  · Do not use any products that contain nicotine or tobacco, such as cigarettes and e-cigarettes. If you need help quitting, ask your health care provider.  · Keep all follow-up visits as told by your health care provider. This is important.  Contact a health care provider if:  · You have a fever.  · Your symptoms get worse.  Get help right away if:  · You have new or worse shortness of breath.  · You develop a blue color (cyanosis) under your fingernails.  Summary  · Pneumonitis is inflammation of the lungs. This condition can be caused by infection or exposure   to certain substances or allergens.  · The most common symptom of this condition is shortness of breath or trouble breathing.  · Treatment depends on the cause of your condition.  This information is not intended to replace advice given to you by your health care provider. Make sure you discuss any questions you have with your health care provider.  Document Released: 09/15/2009 Document Revised: 02/18/2016 Document Reviewed: 02/18/2016  Elsevier Interactive Patient Education © 2017 [employer] Inc[employer]

## 2017-03-17 NOTE — Telephone Encounter (Signed)
Pt  Reports  She  Inhaled   Some  Water  Through her nose  3  Days  Ago while  Swimming  .     She  Denies   Coughing   . She  Reports  Some  Chest  Pain  When she  Takes  A  Deep breath . She  Denies  Any  Shortness of  Breath.   sappt  Made  Today  With Grier Mitts     Reason for Disposition . [1] Chest pain lasting <= 5 minutes AND [2] NO chest pain or cardiac symptoms now(Exceptions: pains lasting a few seconds)  Answer Assessment - Initial Assessment Questions 1. LOCATION: "Where does it hurt?"        Chest  2. RADIATION: "Does the pain go anywhere else?" (e.g., into neck, jaw, arms, back)     Back   When lays   Down  3. ONSET: "When did the chest pain begin?" (Minutes, hours or days)      *No Answer* 4. PATTERN "Does the pain come and go, or has it been constant since it started?"  "Does it get worse with exertion?"      Consistant   X  2  Days   5. DURATION: "How long does it last" (e.g., seconds, minutes, hours)       Constant   When  Takes   Deep  Breath   6. SEVERITY: "How bad is the pain?"  (e.g., Scale 1-10; mild, moderate, or severe)    - MILD (1-3): doesn't interfere with normal activities     - MODERATE (4-7): interferes with normal activities or awakens from sleep    - SEVERE (8-10): excruciating pain, unable to do any normal activities       *Moderate 7. CARDIAC RISK FACTORS: "Do you have any history of heart problems or risk factors for heart disease?" (e.g., prior heart attack, angina; high blood pressure, diabetes, being overweight, high cholesterol, smoking, or strong family history of heart disease)       None 8. PULMONARY RISK FACTORS: "Do you have any history of lung disease?"  (e.g., blood clots in lung, asthma, emphysema, birth control pills)      Nothing 9. CAUSE: "What do you think is causing the chest pain?"       Possibly   Inhaled  Some  Water   While  Swimming   3 days  Ago  Did not  Cough  After   10. OTHER SYMPTOMS: "Do you have any other symptoms?"  (e.g., dizziness, nausea, vomiting, sweating, fever, difficulty breathing, cough)      None 11. PREGNANCY: "Is there any chance you are pregnant?" "When was your last menstrual period?"       No  Protocols used: CHEST PAIN-A-AH

## 2017-03-17 NOTE — Telephone Encounter (Signed)
Left a VM for patient per  Dr. Volanda Napoleon since patient has been experiencing chest pain for the last three days since inhaling pool water she is recommending that patient head to the emergency department. If patient can please give the office a call back regarding matter.

## 2017-03-17 NOTE — Progress Notes (Signed)
Subjective:    Patient ID: Bethany Gardner, female    DOB: 08-11-1960, 56 y.o.   MRN: [id number]  No chief complaint on file.   HPI Patient was seen today for acute concern.  Pt states she woke up Wed night with pain in her chest noted after taking in a deep breath.  Pt denies fever, chills, rhinorrhea, cough, congestion, numbness/tingling in UEs, N/V, ST, HA.  The only thing pt can recall being different is inhaling a "gulp of pool water" Tuesday while swimming at the [facility].  Pt states it is hard to describe the pain.  States "whole chest feels..."  Notes as a 4/10 discomfort when breathing in.  Pt does note she is taking Arimidex.  She was formerly on Tamoxifen.  Pt also expresses concern as her husband had a PE.  Pt also states that she cannot be sick, as she is flying out of town next wk.  Past Medical History:  Diagnosis Date  . Anemia    father has Factor V Leiden-no issues with it. Pt has not been tested  . Endometriosis   . Fibroid   . Foot pain    bilateral - occ foot pain  . Infertility, female   . Malignant neoplasm of lower-inner quadrant of left female breast ([place]) 03/16/2016   left breast DCIS  . Obesity   . Personal history of radiation therapy 2018    No Known Allergies  ROS General: Denies fever, chills, night sweats, changes in weight, changes in appetite HEENT: Denies headaches, ear pain, changes in vision, rhinorrhea, sore throat CV: Denies CP, palpitations, SOB, orthopnea Pulm: Denies SOB, cough, wheezing   +chest discomfort with deep breath in. GI: Denies abdominal pain, nausea, vomiting, diarrhea, constipation GU: Denies dysuria, hematuria, frequency, vaginal discharge Msk: Denies muscle cramps, joint pains Neuro: Denies weakness, numbness, tingling Skin: Denies rashes, bruising Psych: Denies depression, anxiety, hallucinations     Objective:    Blood pressure 120/82, pulse 88, temperature 98.3 F (36.8 C), temperature source Oral.   Gen. Pleasant,  well-nourished, in no distress, normal affect  Non toxic appearing. HEENT: [place]/AT, face symmetric, no scleral icterus, PERRLA, nares patent without drainage, pharynx without erythema or exudate. Neck: No JVD Lungs: no accessory muscle use, CTAB, no dullness to percussion, no wheezes or rales Cardiovascular: RRR, no m/r/g, no peripheral edema Abdomen: BS present, soft, NT/ND Neuro:  A&Ox3, CN II-XII intact, normal gait Skin:  Warm, no lesions/ rash   Wt Readings from Last 3 Encounters:  01/24/17 193 lb 8 oz (87.8 kg)  12/05/16 180 lb (81.6 kg)  05/12/16 188 lb 3.2 oz (85.4 kg)    Lab Results  Component Value Date   WBC 4.7 12/05/2016   HGB 12.9 12/05/2016   HCT 38.3 12/05/2016   PLT 159 12/05/2016   GLUCOSE 172 (H) 12/07/2016   CHOL 231 (H) 08/30/2016   TRIG 84 08/30/2016   HDL 60 08/30/2016   LDLCALC 154 (H) 08/30/2016   ALT 37 12/05/2016   AST 39 12/05/2016   NA 141 12/07/2016   K 3.2 (L) 12/07/2016   CL 108 12/07/2016   CREATININE 0.56 12/07/2016   BUN 7 12/07/2016   CO2 24 12/07/2016   TSH 2.58 08/30/2016   INR 1.02 05/04/2016   HGBA1C 5.2 12/06/2016    Assessment/Plan:  Aspiration pneumonitis (HCC)  -No clinical s/s of pneumonia or PE present. -given handout. -Attempts made to reassure pt.  However, pt requesting unnecessary medical procedures.  Pt then asks if  a particular provider was working today.  When informed that person was not available, pt states "well that's my neighbor, I wonder what she would think about all this". -Pt advised she could proceed to the ED if she felt it was necessary, however she declined. -Pt given ED precautions.   Please be advised in the future this provider will not see this pt. F/u prn

## 2017-03-19 ENCOUNTER — Encounter: Payer: Self-pay | Admitting: Family Medicine

## 2017-04-05 ENCOUNTER — Telehealth: Payer: Self-pay | Admitting: *Deleted

## 2017-04-06 ENCOUNTER — Telehealth: Payer: Self-pay | Admitting: *Deleted

## 2017-04-06 NOTE — Telephone Encounter (Signed)
This RN spoke with pt per her call stating increasing bilateral hand and wrist pain " to the point I can barely lift things "  She states pain started approximately 2 weeks ago and is now more severe " that I could barely cook during the holidays "  She states discomfort is bilateral with movement of bending of wrist as well as twisting motion.   She finds it difficult to lift pots and pans.  Bethany Gardner states she has read above can be a side effect of the anastrozole.  This RN discussed possible exerbation of what she is prescribing as a tendonitis - in relation to the anastrozole.  She has not taken any medications for relief.  This RN discussed with pt use if ibuprofen for above and she can hold the anastrozole for 1 week for assessment of benefit.  Bethany Gardner is to implement above recommendations and call this RN in 1 week with follow up for further plan.

## 2017-04-07 ENCOUNTER — Telehealth: Payer: Self-pay | Admitting: *Deleted

## 2017-04-07 ENCOUNTER — Other Ambulatory Visit: Payer: Self-pay | Admitting: *Deleted

## 2017-04-07 MED ORDER — TRAMADOL HCL 50 MG PO TABS: 50.0000 mg | ORAL_TABLET | Freq: Four times a day (QID) | ORAL | 0 refills | Status: DC | PRN

## 2017-04-07 NOTE — Telephone Encounter (Signed)
This RN spoke with pt per her call stating pain/discomfort in her hands and wrist are better " but now the pain has moved like up my arm in the long bones "  Bethany Gardner describes the discomfort as " excruciating, agonizing ,worst pain ever, a 10 on a scale from 1-10 "  She states relief by not moving arms above waist - " if I try to raise my arms it is awful "  This RN asked what patient is using for discomfort with Bethany Gardner stating " I just took Advil and I cannot see where it is helping much "  Note Bethany Gardner denies any fevers, swelling, redness, or other new medications ( she took valtrex last week only for onset of fever sore ).  Discomfort is bilateral and equal.  Post MD review of above- informed Bethany Gardner he would be glad to work her in to be seen this afternoon - with Bethany Gardner stating she is unsure if she could drive herself to MD office.  Per MD order given for tramadol- informed pt - who asked " my dog takes tramadol - which I get from the vet's office could I use it ?"  This RN informed pt unsure of the composition and dosing in her dog's tramadol and would prefer for script per MD to be sent to her pharmacy.  Pharmacy verified - script obtained and faxed.

## 2017-04-13 ENCOUNTER — Other Ambulatory Visit (HOSPITAL_BASED_OUTPATIENT_CLINIC_OR_DEPARTMENT_OTHER): Payer: Managed Care, Other (non HMO)

## 2017-04-13 ENCOUNTER — Encounter: Payer: Self-pay | Admitting: Adult Health

## 2017-04-13 ENCOUNTER — Telehealth: Payer: Self-pay | Admitting: *Deleted

## 2017-04-13 ENCOUNTER — Inpatient Hospital Stay: Payer: Managed Care, Other (non HMO) | Attending: Adult Health | Admitting: Adult Health

## 2017-04-13 VITALS — BP 130/71 | HR 71 | Temp 98.6°F | Resp 18

## 2017-04-13 DIAGNOSIS — Z170 Estrogen receptor positive status [ER+]: Secondary | ICD-10-CM | POA: Diagnosis not present

## 2017-04-13 DIAGNOSIS — Z79811 Long term (current) use of aromatase inhibitors: Secondary | ICD-10-CM

## 2017-04-13 DIAGNOSIS — R7989 Other specified abnormal findings of blood chemistry: Secondary | ICD-10-CM

## 2017-04-13 DIAGNOSIS — C50312 Malignant neoplasm of lower-inner quadrant of left female breast: Secondary | ICD-10-CM | POA: Diagnosis not present

## 2017-04-13 DIAGNOSIS — M2550 Pain in unspecified joint: Secondary | ICD-10-CM

## 2017-04-13 DIAGNOSIS — M25519 Pain in unspecified shoulder: Secondary | ICD-10-CM

## 2017-04-13 DIAGNOSIS — R200 Anesthesia of skin: Secondary | ICD-10-CM | POA: Diagnosis not present

## 2017-04-13 LAB — CBC WITH DIFFERENTIAL/PLATELET
BASO%: 0.5 % (ref 0.0–2.0)
BASOS ABS: 0 10*3/uL (ref 0.0–0.1)
EOS%: 1.6 % (ref 0.0–7.0)
Eosinophils Absolute: 0.1 10*3/uL (ref 0.0–0.5)
HEMATOCRIT: 36.8 % (ref 34.8–46.6)
HEMOGLOBIN: 12.6 g/dL (ref 11.6–15.9)
LYMPH#: 1 10*3/uL (ref 0.9–3.3)
LYMPH%: 24.2 % (ref 14.0–49.7)
MCH: 31.6 pg (ref 25.1–34.0)
MCHC: 34.2 g/dL (ref 31.5–36.0)
MCV: 92.2 fL (ref 79.5–101.0)
MONO#: 0.4 10*3/uL (ref 0.1–0.9)
MONO%: 8.7 % (ref 0.0–14.0)
NEUT#: 2.8 10*3/uL (ref 1.5–6.5)
NEUT%: 65 % (ref 38.4–76.8)
Platelets: 196 10*3/uL (ref 145–400)
RBC: 3.99 10*6/uL (ref 3.70–5.45)
RDW: 12.9 % (ref 11.2–14.5)
WBC: 4.3 10*3/uL (ref 3.9–10.3)

## 2017-04-13 LAB — COMPREHENSIVE METABOLIC PANEL
ALBUMIN: 4 g/dL (ref 3.5–5.0)
ALK PHOS: 124 U/L (ref 40–150)
ALT: 133 U/L — AB (ref 0–55)
ANION GAP: 9 meq/L (ref 3–11)
AST: 88 U/L — ABNORMAL HIGH (ref 5–34)
BILIRUBIN TOTAL: 0.37 mg/dL (ref 0.20–1.20)
BUN: 14.3 mg/dL (ref 7.0–26.0)
CALCIUM: 9.2 mg/dL (ref 8.4–10.4)
CO2: 26 meq/L (ref 22–29)
Chloride: 105 mEq/L (ref 98–109)
Creatinine: 0.8 mg/dL (ref 0.6–1.1)
Glucose: 94 mg/dl (ref 70–140)
Potassium: 4 mEq/L (ref 3.5–5.1)
Sodium: 139 mEq/L (ref 136–145)
TOTAL PROTEIN: 7.5 g/dL (ref 6.4–8.3)

## 2017-04-13 LAB — DRAW EXTRA CLOT TUBE

## 2017-04-13 NOTE — Progress Notes (Addendum)
[facility]  [phone] [phone]     ID: Bethany Gardner DOB: April 05, 1961  MR#: [id number]  TGG#:[id number]  Patient Care Team: Lucretia Kern, DO as PCP - General (Family Medicine) Jovita Kussmaul, MD as Consulting Physician (General Surgery) Magrinat, Virgie Dad, MD as Consulting Physician (Oncology) Kyung Rudd, MD as Consulting Physician (Radiation Oncology) Salvadore Dom, MD as Consulting Physician (Obstetrics and Gynecology) Delice Bison, Charlestine Massed, NP as Nurse Practitioner (Hematology and Oncology) Scot Dock, NP OTHER MD:  CHIEF COMPLAINT: Ductal carcinoma in situ  CURRENT TREATMENT: Anastrozole   BREAST CANCER HISTORY: From the original consult note:  Nyssa had screening mammography with tomography at the [facility] 03/08/2016 showing suspicious calcifications in the left breast. Left diagnostic mammography with tomography and left breast ultrasonography 03/14/2016 found the breast density to be category B. In the lower inner quadrant of the left breast there were calcifications measuring 2.1 cm, in a linear arrangement. There was also an area of asymmetry but no palpable abnormality on exam. Ultrasonography of the breast was noninformative an ultrasound of the breast was mammographically benign.  Biopsy of the area of calcifications 03/14/2016 showed ([id number]) ductal carcinoma in situ, grade 3, involving a complex sclerosing lesion. Some areas were suspicious but not diagnostic for invasion. This was estrogen receptor positive at 60% with moderate staining, but progesterone receptor negative.  Her subsequent history is as detailed below  INTERVAL HISTORY: [place] returns today for follow-up and treatment of her estrogen receptor positive breast cancer. She is here today to discuss arthralgias with Anastrozole.    REVIEW OF SYSTEMS: Jaiden started Anastrozole about 6-7 weeks ago.  She noted over the holidays significant  increase in joint pain.  This was worse in her wrists, hands and shoulders.  She had difficulty with ROM.  She last took it on 12/25.  She was prescribed Tramadol, however this hasn't really helped.  She takes two aleve in the morning and this helps minimally.  She is concerned that the medication has triggered something in her body that has caused this.  She denies pain in her hips, or pain in her neck.  At first she said the pain wasn't improved, then she stated that it was maybe 30% improved.  She is moving to [place] and wants to know if there is an oncologist we recommend there.  Otherwise, a detailed ROS was conducted and is non contributory.    PAST MEDICAL HISTORY: Past Medical History:  Diagnosis Date  . Anemia    father has Factor V Leiden-no issues with it. Pt has not been tested  . Endometriosis   . Fibroid   . Foot pain    bilateral - occ foot pain  . Infertility, female   . Malignant neoplasm of lower-inner quadrant of left female breast ([place]) 03/16/2016   left breast DCIS  . Obesity   . Personal history of radiation therapy 2018    PAST SURGICAL HISTORY: Past Surgical History:  Procedure Laterality Date  . ABDOMINAL HYSTERECTOMY    . BREAST BIOPSY Left    benign  . BREAST LUMPECTOMY Left 2018  . BREAST LUMPECTOMY WITH RADIOACTIVE SEED AND SENTINEL LYMPH NODE BIOPSY Left 04/15/2016   Procedure: BREAST LUMPECTOMY WITH RADIOACTIVE SEED AND SENTINEL LYMPH NODE BIOPSY;  Surgeon: Autumn Messing III, MD;  Location: [facility];  Service: General;  Laterality: Left;  BREAST LUMPECTOMY WITH RADIOACTIVE SEED AND SENTINEL LYMPH NODE BIOPSY  . DILATION AND CURETTAGE OF UTERUS    .  laproscopy     three  . TONSILLECTOMY      FAMILY HISTORY Family History  Problem Relation Age of Onset  . Heart disease Father   . Heart attack Father 41  . Transient ischemic attack Mother   . Stroke Mother   . Breast cancer Maternal Grandmother 60  . Melanoma Sister 48  . Scleroderma  Paternal Uncle 34  . Heart attack Paternal Grandfather 83  . Colon cancer Neg Hx   . Colon polyps Neg Hx   . Esophageal cancer Neg Hx   . Rectal cancer Neg Hx   . Stomach cancer Neg Hx   The patient's parents are living, in their late 29s as of December 2017. The patient has one brother, 2 sisters. The patient's sister was diagnosed with melanoma at age 8. There is no history of breast or ovarian cancer in the family.   GYNECOLOGIC HISTORY:  No LMP recorded. Patient has had a hysterectomy. Menarche age 23, first live birth age 57. The patient is GX P1. She underwent hysterectomy with no salpingo-oophorectomy in 2010. She did not take hormone replacement. 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 They moved here from the [facility] area in 2016. The patient's daughter patent is an Statistician in [place]. The patient's adopted son Laurann Montana lives at home with the patient.     ADVANCED DIRECTIVES: not in place   HEALTH MAINTENANCE: Social History   Tobacco Use  . Smoking status: Never Smoker  . Smokeless tobacco: Never Used  Substance Use Topics  . Alcohol use: No  . Drug use: No     Colonoscopy:October 2016, [facility]  PAP:  Bone density:   No Known Allergies  Current Outpatient Medications  Medication Sig Dispense Refill  . ibuprofen (ADVIL,MOTRIN) 600 MG tablet Take 1 tablet (600 mg total) by mouth every 6 (six) hours as needed. 30 tablet 0  . Nutritional Supplements (JUICE PLUS FIBRE PO) Take by mouth.    . TURMERIC CURCUMIN PO Take 1 capsule by mouth daily.    Marland Kitchen VALERIAN ROOT PO Take by mouth.    Marland Kitchen anastrozole (ARIMIDEX) 1 MG tablet Take 1 tablet (1 mg total) by mouth daily. (Patient not taking: Reported on 04/13/2017) 90 tablet 0  . traMADol (ULTRAM) 50 MG tablet Take 1  tablet (50 mg total) by mouth every 6 (six) hours as needed. (Patient not taking: Reported on 04/13/2017) 60 tablet 0   No current facility-administered medications for this visit.    Facility-Administered Medications Ordered in Other Visits  Medication Dose Route Frequency Provider Last Rate Last Dose  . dextrose 5 % and 0.9 % NaCl with KCl 20 mEq/L infusion   Intravenous Continuous Autumn Messing III, MD      . heparin injection 5,000 Units  5,000 Units Subcutaneous Q8H Autumn Messing III, MD      . HYDROcodone-acetaminophen (NORCO/VICODIN) 5-325 MG per tablet 1-2 tablet  1-2 tablet Oral Q4H PRN Autumn Messing III, MD      . ondansetron (ZOFRAN-ODT) disintegrating tablet 4 mg  4 mg Oral Q6H PRN Autumn Messing III, MD       Or  . ondansetron (ZOFRAN) 4 mg in sodium chloride 0.9 % 50 mL IVPB  4 mg Intravenous Q6H PRN Jovita Kussmaul, MD      .  pantoprazole (PROTONIX) injection 40 mg  40 mg Intravenous QHS Autumn Messing III, MD        OBJECTIVE:    Vitals:   04/13/17 1335  BP: 130/71  Pulse: 71  Resp: 18  Temp: 98.6 F (37 C)  SpO2: 100%     There is no height or weight on file to calculate BMI.    ECOG FS:0 - Asymptomatic GENERAL: Patient is a well appearing female in no acute distress HEENT:  Sclerae anicteric.  Oropharynx clear and moist. No ulcerations or evidence of oropharyngeal candidiasis. Neck is supple.  NODES:  No cervical, supraclavicular, or axillary lymphadenopathy palpated.  BREAST EXAM:  Deferred. LUNGS:  Clear to auscultation bilaterally.  No wheezes or rhonchi. HEART:  Regular rate and rhythm. No murmur appreciated. ABDOMEN:  Soft, nontender.  Positive, normoactive bowel sounds. No organomegaly palpated. MSK:  No focal spinal tenderness to palpation. Full range of motion bilaterally in the upper extremities. EXTREMITIES:  No peripheral edema.   SKIN:  Clear with no obvious rashes or skin changes. No nail dyscrasia. NEURO:  Nonfocal. Well oriented.  Appropriate affect.     LAB  RESULTS:  CMP     Component Value Date/Time   NA 141 12/07/2016 0501   NA 141 03/23/2016 1240   K 3.2 (L) 12/07/2016 0501   K 4.7 03/23/2016 1240   CL 108 12/07/2016 0501   CO2 24 12/07/2016 0501   CO2 25 03/23/2016 1240   GLUCOSE 172 (H) 12/07/2016 0501   GLUCOSE 97 03/23/2016 1240   BUN 7 12/07/2016 0501   BUN 13.0 03/23/2016 1240   CREATININE 0.56 12/07/2016 0501   CREATININE 0.7 03/23/2016 1240   CALCIUM 8.2 (L) 12/07/2016 0501   CALCIUM 9.6 03/23/2016 1240   PROT 7.1 12/05/2016 1348   PROT 7.8 03/23/2016 1240   ALBUMIN 3.8 12/05/2016 1348   ALBUMIN 4.0 03/23/2016 1240   AST 39 12/05/2016 1348   AST 21 03/23/2016 1240   ALT 37 12/05/2016 1348   ALT 20 03/23/2016 1240   ALKPHOS 63 12/05/2016 1348   ALKPHOS 79 03/23/2016 1240   BILITOT 0.6 12/05/2016 1348   BILITOT 0.42 03/23/2016 1240   GFRNONAA >60 12/07/2016 0501   GFRAA >60 12/07/2016 0501    INo results found for: SPEP, UPEP  Lab Results  Component Value Date   WBC 4.3 04/13/2017   NEUTROABS 2.8 04/13/2017   HGB 12.6 04/13/2017   HCT 36.8 04/13/2017   MCV 92.2 04/13/2017   PLT 196 04/13/2017      Chemistry      Component Value Date/Time   NA 141 12/07/2016 0501   NA 141 03/23/2016 1240   K 3.2 (L) 12/07/2016 0501   K 4.7 03/23/2016 1240   CL 108 12/07/2016 0501   CO2 24 12/07/2016 0501   CO2 25 03/23/2016 1240   BUN 7 12/07/2016 0501   BUN 13.0 03/23/2016 1240   CREATININE 0.56 12/07/2016 0501   CREATININE 0.7 03/23/2016 1240      Component Value Date/Time   CALCIUM 8.2 (L) 12/07/2016 0501   CALCIUM 9.6 03/23/2016 1240   ALKPHOS 63 12/05/2016 1348   ALKPHOS 79 03/23/2016 1240   AST 39 12/05/2016 1348   AST 21 03/23/2016 1240   ALT 37 12/05/2016 1348   ALT 20 03/23/2016 1240   BILITOT 0.6 12/05/2016 1348   BILITOT 0.42 03/23/2016 1240       No results found for: LABCA2  No components found for: [id number]  No  results for input(s): INR in the last 168 hours.  Urinalysis No  results found for: COLORURINE, APPEARANCEUR, LABSPEC, PHURINE, GLUCOSEU, HGBUR, BILIRUBINUR, KETONESUR, PROTEINUR, UROBILINOGEN, NITRITE, LEUKOCYTESUR   STUDIES: No results found.  ELIGIBLE FOR AVAILABLE RESEARCH PROTOCOL:  no  ASSESSMENT: 57 y.o. [place] woman status post left breast lower inner quadrant biopsy 03/14/2016 for ductal carcinoma in situ, grade 3, estrogen receptor positive, progesterone receptor negative  (1) Left lumpectomy and SLNB on 04/15/2016: DCIS, high grade, 4 SLN negative, margins neg.  Tis, N0, stage 0.  (2) adjuvant radiation from 05/23/2016 through 07/08/2016: 1) Left breast/ 50.4 Gy in 28 fractionns 2) Left breast boost/ 14 Gy in 7 fractions  (3)  Tamoxifen started 03/24/2016 neoadjuvantly, held during radiation, resumed April 2017, stopped in July, 2018 due to sleep issues, Anastrozole started 01/2017 (unable to tolerate, stopped 12/25)  (4) the patient qualifies for genetics testing, but declined (see note 07/12/2016)  PLAN: Hollye is feeling very unwell.  Her joints are aching tremendously.  She is slightly better, but not much.  She has undergone lab testing with CBC, CMP, CA 27-29, ANA, and Sed rate. This will help determine if there is a rheumatologic concern.  She also met with Dr.  Jana Hakim today to discuss her future breast cancer risk.  He suggested she continue to give it time for the medication to leave her body and if it continues to be an issue, that she have a scan of her neck.  She will call us in 2 weeks to discuss.  We should have her labs back by Monday/Tuesday and we will call her with results.     Scot Dock, NP   04/13/2017 1:40 PM Medical Oncology and Hematology [facility] [address] [place], [place] [address] Tel. [phone]    Fax. [phone]   ADDENDUM: Yerby reaction to aromatase inhibitors, if that is what we are dealing with, certainly would be unusual and very extreme.  We obtain a sed  rate and ANA and I called her today (04/15/2017) with those results, which are benign.  She tells me as of today her pain in general is better.  She still has a lot of numbness in both hands particularly when she wakes up in the morning.  She has pain in the right shoulder after doing some vacuuming.  The other pains kind of come and go, but mostly are going.  I offered her an MRI of the neck.  I also suggested she might try to obtain wrist splints and wear those at night and see if that helps with the numbness that she is experiencing.  At this point what she wants to do is wait a little bit longer and I asked her to call us on 04/20/2017 to let us know how the pain is going.  Assuming things are getting better than all she will need follow-up.  At this point we are not planning any further attempts at antiestrogens.  She will be getting her mammograms again in July.  Accordingly I would plan to see her again in August, with no lab work at that time.  I did discuss the fact that her liver function tests were slightly elevated.  She tells me that she had been doing a little bit more drinking over the holidays than usual.  She is having no symptoms of nausea, altered taste, or loss of appetite.     I personally saw this patient and performed a substantive portion  of this encounter with the listed APP documented above.   Bobetta Lime, MD Medical Oncology and Hematology [facility] [address] [place], [place] [address] Tel. [phone]    Fax. [phone]

## 2017-04-13 NOTE — Telephone Encounter (Signed)
This RN returned VM from pt per her update stating she has continued discomfort in her arms and shoulders " this is now day 8 of excruciating- extreme discomfort and I am starting to think this my new normal "  " I need to be seen because I cannot go thru another weekend feeling like this "  Appointment made by this RN with mid level due to concerns with side effects of anastrozole.  See prior phone notes for pt's contact with hand and wrist pain occurring last week - then moving up the arms and in her shoulders - causing interference in her ADL's. Anastrozole was stopped with pt having unresolved discomfort.  Labs with extra tubes to be ordered will inquire with MD for particular labs needed.

## 2017-04-14 LAB — ANTINUCLEAR ANTIBODIES, IFA: ANTINUCLEAR ANTIBODIES, IFA: NEGATIVE

## 2017-04-14 LAB — SEDIMENTATION RATE: SED RATE: 17 mm/h (ref 0–40)

## 2017-04-14 LAB — CANCER ANTIGEN 27.29: CA 27.29: 12.2 U/mL (ref 0.0–38.6)

## 2017-04-15 ENCOUNTER — Other Ambulatory Visit: Payer: Self-pay | Admitting: Oncology

## 2017-04-18 ENCOUNTER — Telehealth: Payer: Self-pay | Admitting: Adult Health

## 2017-04-18 NOTE — Telephone Encounter (Signed)
Per Mendel Ryder patient does not need a appointment she is moving.

## 2017-04-20 ENCOUNTER — Encounter: Payer: Self-pay | Admitting: Family Medicine

## 2017-04-20 ENCOUNTER — Telehealth: Payer: Self-pay | Admitting: *Deleted

## 2017-04-20 ENCOUNTER — Ambulatory Visit (INDEPENDENT_AMBULATORY_CARE_PROVIDER_SITE_OTHER)
Admission: RE | Admit: 2017-04-20 | Discharge: 2017-04-20 | Disposition: A | Discharge status: Home / Self Care | Payer: Managed Care, Other (non HMO) | Source: Ambulatory Visit | Attending: Family Medicine | Admitting: Family Medicine

## 2017-04-20 ENCOUNTER — Ambulatory Visit: Payer: Managed Care, Other (non HMO) | Admitting: Family Medicine

## 2017-04-20 VITALS — BP 108/70 | HR 83 | Temp 98.2°F | Ht 66.0 in

## 2017-04-20 DIAGNOSIS — M2550 Pain in unspecified joint: Secondary | ICD-10-CM

## 2017-04-20 DIAGNOSIS — M79601 Pain in right arm: Secondary | ICD-10-CM | POA: Diagnosis not present

## 2017-04-20 DIAGNOSIS — M79642 Pain in left hand: Secondary | ICD-10-CM | POA: Diagnosis not present

## 2017-04-20 DIAGNOSIS — M79641 Pain in right hand: Secondary | ICD-10-CM

## 2017-04-20 DIAGNOSIS — M79602 Pain in left arm: Secondary | ICD-10-CM | POA: Diagnosis not present

## 2017-04-20 LAB — VITAMIN D 25 HYDROXY (VIT D DEFICIENCY, FRACTURES): VITD: 30.86 ng/mL (ref 30.00–100.00)

## 2017-04-20 LAB — VITAMIN B12: VITAMIN B 12: 361 pg/mL (ref 211–911)

## 2017-04-20 NOTE — Progress Notes (Signed)
HPI:  Acute visit for hand and arm pain: -report started arimidex several weeks ago, also a new supplement drink from an [place]  -Dec 26th had acute onset of pain all over and a cold sore - most pain was in the hands and wrists bilaterally, some in the shoulders and knees and muscles of arms also, also some tingling and hands when wakes up then resolves -saw her oncologist about this and arimdex was stopped, impoved some but not gone so onc did CMP, CBC, ESR, ANA, ca 29.29 and was told all normal - they told her they could do imaging neck but labs looked good -denies: fevers, swelling of joints, redness, rash, tick bites, malaise -LFTs mildly up, but she had alcohol so was told by oncologist this was from that -saw endo recently and thyroid labs normal -she is getting ready to move to [place], does not feel like activities out of the ordinary  ROS: See pertinent positives and negatives per HPI.  Past Medical History:  Diagnosis Date  . Anemia    father has Factor V Leiden-no issues with it. Pt has not been tested  . Endometriosis   . Fibroid   . Foot pain    bilateral - occ foot pain  . Infertility, female   . 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 Obesity   . Personal history of radiation therapy 2018    Past Surgical History:  Procedure Laterality Date  . ABDOMINAL HYSTERECTOMY    . BREAST BIOPSY Left    benign  . BREAST LUMPECTOMY Left 2018  . BREAST LUMPECTOMY WITH RADIOACTIVE SEED AND SENTINEL LYMPH NODE BIOPSY Left 04/15/2016   Procedure: BREAST LUMPECTOMY WITH RADIOACTIVE SEED AND SENTINEL LYMPH NODE BIOPSY;  Surgeon: Autumn Messing III, MD;  Location: [facility];  Service: General;  Laterality: Left;  BREAST LUMPECTOMY WITH RADIOACTIVE SEED AND SENTINEL LYMPH NODE BIOPSY  . DILATION AND CURETTAGE OF UTERUS    . laproscopy     three  . TONSILLECTOMY      Family History   Problem Relation Age of Onset  . Heart disease Father   . Heart attack Father 76  . Transient ischemic attack Mother   . Stroke Mother   . Breast cancer Maternal Grandmother 60  . Melanoma Sister 67  . Scleroderma Paternal Uncle 80  . Heart attack Paternal Grandfather 18  . Colon cancer Neg Hx   . Colon polyps Neg Hx   . Esophageal cancer Neg Hx   . Rectal cancer Neg Hx   . Stomach cancer Neg Hx     Social History   Socioeconomic History  . Marital status: Married    Spouse name: None  . Number of children: None  . Years of education: None  . Highest education level: None  Social Needs  . Financial resource strain: None  . Food insecurity - worry: None  . Food insecurity - inability: None  . Transportation needs - medical: None  . Transportation needs - non-medical: None  Occupational History  . None  Tobacco Use  . Smoking status: Never Smoker  . Smokeless tobacco: Never Used  Substance and Sexual Activity  . Alcohol use: No  . Drug use: No  . Sexual activity: Yes    Partners: Male    Birth control/protection: Surgical    Comment: Hysterectomy  Other Topics Concern  . None  Social History Narrative  Work or School: homemaker      Home Situation: lives with husband and son in [place] (also has a daughter)      Spiritual Beliefs:  Christian      Lifestyle: restarting a healthy diet and exercise in 2016        Current Outpatient Medications:  .  ibuprofen (ADVIL,MOTRIN) 600 MG tablet, Take 1 tablet (600 mg total) by mouth every 6 (six) hours as needed., Disp: 30 tablet, Rfl: 0 .  Misc Natural Products (GLUCOSAMINE CHOND COMPLEX/MSM PO), Take by mouth., Disp: , Rfl:  .  Nutritional Supplements (JUICE PLUS FIBRE PO), Take by mouth., Disp: , Rfl:  .  traMADol (ULTRAM) 50 MG tablet, TK 1 T PO  Q 6 H PRN, Disp: , Rfl: 0 .  TURMERIC CURCUMIN PO, Take 1 capsule by mouth daily., Disp: , Rfl:  .  VALERIAN ROOT PO, Take by mouth., Disp: , Rfl:  No current  facility-administered medications for this visit.   Facility-Administered Medications Ordered in Other Visits:  .  dextrose 5 % and 0.9 % NaCl with KCl 20 mEq/L infusion, , Intravenous, Continuous, Autumn Messing III, MD .  heparin injection 5,000 Units, 5,000 Units, Subcutaneous, Q8H, Autumn Messing III, MD .  HYDROcodone-acetaminophen (NORCO/VICODIN) 5-325 MG per tablet 1-2 tablet, 1-2 tablet, Oral, Q4H PRN, Autumn Messing III, MD .  ondansetron (ZOFRAN-ODT) disintegrating tablet 4 mg, 4 mg, Oral, Q6H PRN **OR** ondansetron (ZOFRAN) 4 mg in sodium chloride 0.9 % 50 mL IVPB, 4 mg, Intravenous, Q6H PRN, Autumn Messing III, MD .  pantoprazole (PROTONIX) injection 40 mg, 40 mg, Intravenous, QHS, Autumn Messing III, MD  EXAM:  Vitals:   04/20/17 1123  BP: 108/70  Pulse: 83  Temp: 98.2 F (36.8 C)    Body mass index is 31.23 kg/m.  GENERAL: vitals reviewed and listed above, alert, oriented, appears well hydrated and in no acute distress  HEENT: atraumatic, conjunttiva clear, no obvious abnormalities on inspection of external nose and ears  NECK: no obvious masses on inspection  LUNGS: clear to auscultation bilaterally, no wheezes, rales or rhonchi, good air movement  CV: HRRR, no peripheral edema  MS/NEURO: moves all extremities without noticeable abnormality, normal function extremities, normal gain, no swelling or erythema joints, no sig TTP on exam today, normal ROM head and neck and other jts without any bony TTP, neg Spurling, normal strength, sensitivity in upper extremities bilat  PSYCH: pleasant and cooperative, no obvious depression or anxiety  ASSESSMENT AND PLAN:  Discussed the following assessment and plan:  Polyarthralgia - Plan: Parvovirus B19 Antibody, IGG and IGM, Hepatitis Acute Panel  Pain in both upper extremities - Plan: Vitamin B12, VITAMIN D 25 Hydroxy (Vit-D Deficiency, Fractures), DG Cervical Spine Complete  Pain in both hands - Plan: DG Hand Complete Left  -we discussed  possible serious and likely etiologies, workup and treatment, treatment risks and return precautions - seems to have improved significantly  -after this discussion, Lamees opted for further labs per above, hold supplement drink, hold all supplements 1 week prior to next liver check, xrays per above, healthy diet, symptomatic care per below -follow up advised 1 month -of course, we advised Maddie  to return or notify a doctor immediately if symptoms worsen or persist or new concerns arise.  Patient Instructions  BEFORE YOU LEAVE: -labs -xray sheet -follow up:  1 month, will plan to recheck liver function then  Stop the supplement drink  No tylenol or alcohol  Aleve or ibuprofen if  needed per instruction for pain, topical menthol or capsaicin creams can also be helpful. Plenty of healthy food and water.  We have ordered labs or studies at this visit. It can take up to 1-2 weeks for results and processing. IF results require follow up or explanation, we will call you with instructions. Clinically stable results will be released to your [facility]. If you have not heard from [place] or cannot find your results in [facility] in 2 weeks please contact our office at [phone].  If you are not yet signed up for [facility], please consider signing up.  I hope you are feeling better soon! Seek care sooner if your symptoms worsen or new concerns arise.           Colin Benton R., DO

## 2017-04-20 NOTE — Telephone Encounter (Signed)
Patient questioned what she should be cautious of having being diagnosed with a virus at the visit today as she has a grandchild she cares for?  Message sent to Dr Maudie Mercury.

## 2017-04-20 NOTE — Patient Instructions (Signed)
BEFORE YOU LEAVE: -labs -xray sheet -follow up:  1 month, will plan to recheck liver function then  Stop the supplement drink  No tylenol or alcohol  Aleve or ibuprofen if needed per instruction for pain, topical menthol or capsaicin creams can also be helpful. Plenty of healthy food and water.  We have ordered labs or studies at this visit. It can take up to 1-2 weeks for results and processing. IF results require follow up or explanation, we will call you with instructions. Clinically stable results will be released to your [facility]. If you have not heard from [place] or cannot find your results in [facility] in 2 weeks please contact our office at [phone].  If you are not yet signed up for [facility], please consider signing up.  I hope you are feeling better soon! Seek care sooner if your symptoms worsen or new concerns arise.

## 2017-04-21 ENCOUNTER — Other Ambulatory Visit: Payer: Self-pay | Admitting: *Deleted

## 2017-04-21 NOTE — Telephone Encounter (Signed)
I called the pt and informed her of the information below.  Patient wanted to let Dr Maudie Mercury know she feels this has now moved in to her knees since last night.  Message sent to Dr Maudie Mercury.

## 2017-04-21 NOTE — Telephone Encounter (Signed)
Likely  Not contagious at this point if it is a virus. But should use general precautions this time of the year anyway around children as is the flu season. Wash hands frequently, do not cough into air or hands, etc.

## 2017-04-21 NOTE — Telephone Encounter (Signed)
If labs don't show anything (still waiting on results) and she keeps having issues I would suggest a referral to rheumatology or could try a short course of prednisone.

## 2017-04-21 NOTE — Telephone Encounter (Signed)
I called the pt and informed her of the message below

## 2017-04-24 ENCOUNTER — Telehealth: Payer: Self-pay | Admitting: Family Medicine

## 2017-04-24 NOTE — Progress Notes (Signed)
HPI:  Follow up jt and mucle soreness: -started acutely a few weeks ago, was doing better  -saw oncologist initially and thought was that this was reaction to arimidex  -the saw me several weeks in and was feeling better but without complete resolution of symptoms and we added labs and imaging - see chart -hep panel, vit d, b12, cbc with diff, cmp, ca 27.29, sed rate, ana, hand plain films all fairly unremarkable, (except lfts mildly elevated but had been drinking alcohol), neck films with some ddd, decreased bone density, parvovirus b19 pending -I advised rheum eval if ongoing issues -reports "do so much better" , symptoms today even "80%" better then a few days ago in terms of pain in joints and muscles - symptoms were hand, shoulder, knee, arm pain, now resolved -new issues of nasal congestion, sore throat, throat feels sore when swallows for a few days -denies fevers, malaise, sob, doe -she is mainly very frustrated that she does not have a definitive answer for what caused her symptoms -reports has given up alcohol -mother has fibromyalgia that started around this age  ROS: See pertinent positives and negatives per HPI.  Past Medical History:  Diagnosis Date  . Anemia    father has Factor V Leiden-no issues with it. Pt has not been tested  . Endometriosis   . Fibroid   . Foot pain    bilateral - occ foot pain  . Infertility, female   . 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 Obesity   . Personal history of radiation therapy 2018    Past Surgical History:  Procedure Laterality Date  . ABDOMINAL HYSTERECTOMY    . BREAST BIOPSY Left    benign  . BREAST LUMPECTOMY Left 2018  . BREAST LUMPECTOMY WITH RADIOACTIVE SEED AND SENTINEL LYMPH NODE BIOPSY Left 04/15/2016   Procedure: BREAST LUMPECTOMY WITH RADIOACTIVE SEED AND SENTINEL LYMPH NODE BIOPSY;  Surgeon: Autumn Messing III, MD;  Location: [facility];   Service: General;  Laterality: Left;  BREAST LUMPECTOMY WITH RADIOACTIVE SEED AND SENTINEL LYMPH NODE BIOPSY  . DILATION AND CURETTAGE OF UTERUS    . laproscopy     three  . TONSILLECTOMY      Family History  Problem Relation Age of Onset  . Heart disease Father   . Heart attack Father 54  . Transient ischemic attack Mother   . Stroke Mother   . Breast cancer Maternal Grandmother 60  . Melanoma Sister 25  . Scleroderma Paternal Uncle 67  . Heart attack Paternal Grandfather 44  . Colon cancer Neg Hx   . Colon polyps Neg Hx   . Esophageal cancer Neg Hx   . Rectal cancer Neg Hx   . Stomach cancer Neg Hx     Social History   Socioeconomic History  . Marital status: Married    Spouse name: None  . Number of children: None  . Years of education: None  . Highest education level: None  Social Needs  . Financial resource strain: None  . Food insecurity - worry: None  . Food insecurity - inability: None  . Transportation needs - medical: None  . Transportation needs - non-medical: None  Occupational History  . None  Tobacco Use  . Smoking status: Never Smoker  . Smokeless tobacco: Never Used  Substance and Sexual Activity  . Alcohol use: No  . Drug use: No  . Sexual activity: Yes  Partners: Male    Birth control/protection: Surgical    Comment: Hysterectomy  Other Topics Concern  . None  Social History Narrative   Work or School: homemaker      Home Situation: lives with husband and son in [place] (also has a daughter)      Spiritual Beliefs:  Christian      Lifestyle: restarting a healthy diet and exercise in 2016        Current Outpatient Medications:  .  ibuprofen (ADVIL,MOTRIN) 600 MG tablet, Take 1 tablet (600 mg total) by mouth every 6 (six) hours as needed., Disp: 30 tablet, Rfl: 0 .  Misc Natural Products (GLUCOSAMINE CHOND COMPLEX/MSM PO), Take by mouth., Disp: , Rfl:  .  Nutritional Supplements (JUICE PLUS FIBRE PO), Take by mouth., Disp: , Rfl:   .  TURMERIC CURCUMIN PO, Take 1 capsule by mouth daily., Disp: , Rfl:  .  VALERIAN ROOT PO, Take by mouth., Disp: , Rfl:  No current facility-administered medications for this visit.   Facility-Administered Medications Ordered in Other Visits:  .  dextrose 5 % and 0.9 % NaCl with KCl 20 mEq/L infusion, , Intravenous, Continuous, Autumn Messing III, MD .  heparin injection 5,000 Units, 5,000 Units, Subcutaneous, Q8H, Autumn Messing III, MD .  HYDROcodone-acetaminophen (NORCO/VICODIN) 5-325 MG per tablet 1-2 tablet, 1-2 tablet, Oral, Q4H PRN, Autumn Messing III, MD .  ondansetron (ZOFRAN-ODT) disintegrating tablet 4 mg, 4 mg, Oral, Q6H PRN **OR** ondansetron (ZOFRAN) 4 mg in sodium chloride 0.9 % 50 mL IVPB, 4 mg, Intravenous, Q6H PRN, Autumn Messing III, MD .  pantoprazole (PROTONIX) injection 40 mg, 40 mg, Intravenous, QHS, Autumn Messing III, MD  EXAM:  Vitals:   04/25/17 0821  BP: 110/64  Pulse: 73  Temp: 97.7 F (36.5 C)    Body mass index is 31.23 kg/m.  GENERAL: vitals reviewed and listed above, alert, oriented, appears well hydrated and in no acute distress  HEENT: atraumatic, conjunttiva clear, no obvious abnormalities on inspection of external nose and ears, normal appearance of ear canals and TMs, clear nasal congestion, mild post oropharyngeal erythema with PND, no tonsillar edema or exudate, no sinus TTP  NECK: no obvious masses on inspection, mild hyoid click L  LUNGS: clear to auscultation bilaterally, no wheezes, rales or rhonchi, good air movement  CV: HRRR, no peripheral edema  MS: moves all extremities without noticeable abnormality, normal inspection hands and wrists, head and neck ROM and appearance  PSYCH: pleasant and cooperative, no obvious depression or anxiety  ASSESSMENT AND PLAN:  Discussed the following assessment and plan:  Sore throat -likely viral per symptoms and exam, offered strep testing -advised ENT eval if persists or worsens given hx and her  concern  Body aches -we discussed possible serious and likely etiologies, workup and treatment, treatment risks and return precautions - I am so thankful this seems to have resolved and medication reaction or virus high on differential vs other - parvo test still pending -after this discussion, [place]ak[place] opted for observation and f/u with onc/rheum eval if recurs -follow up advised with onc regardless in 3 months her or in new place she is moving to and advise to take xray report from neck -of course, we advised Lani  to return or notify a doctor immediately if symptoms worsen or persist or new concerns arise.  Elevated liver enzymes -advised recheck, she wants to wait a few weeks, orders placed  Alcohol use -reports she has stopped drinking -advise no alcohol for  1 week prior to recheck LFTs and congratulated on not using  -Patient advised to return or notify a doctor immediately if symptoms worsen or persist or new concerns arise.  Patient Instructions  BEFORE YOU LEAVE: -lab visit in 2 weeks to check liver function -print out of neck xray report -check on parvo lab  I am so glad you are doing better!  Follow up with your ENT doctor if throat symptoms worsen or persist next week.  Follow up with your oncologist about the bone density and if symptoms recur. Also would advise rheumatology evaluation if  recurrent severe or diffuse joint pains.    Colin Benton R., DO

## 2017-04-24 NOTE — Telephone Encounter (Signed)
Copied from [place] [id number]. Topic: Quick Communication - Lab Results >> Apr 24, 2017  9:19 AM Darl Householder, RMA wrote: Patient is calling requesting a call back from Dr. Maudie Mercury or CMA concerning following up with bone and joint pain also lab results, please return pt call

## 2017-04-24 NOTE — Telephone Encounter (Signed)
Copied from [place] [id number]. Topic: Quick Communication - Lab Results >> Apr 24, 2017  3:10 PM Conception Chancy, NT wrote: Patient is calling back and would like a call back sometime today.

## 2017-04-24 NOTE — Telephone Encounter (Signed)
Labs ok so far. Some deg arthrtis neck. Still waiting on parvovirus result. Hope she is feeling better? See prior notes - advised referral to rheum if persistent symptoms. May want to follow up with her onc also. Could do short course prednisone to see if helps in interim.

## 2017-04-24 NOTE — Telephone Encounter (Signed)
I called the pt and informed her of the message below.  She stated she is in a lot of pain and has an appt tomorrow morning with Dr Maudie Mercury.

## 2017-04-25 ENCOUNTER — Ambulatory Visit (INDEPENDENT_AMBULATORY_CARE_PROVIDER_SITE_OTHER): Payer: Managed Care, Other (non HMO) | Admitting: Family Medicine

## 2017-04-25 ENCOUNTER — Encounter: Payer: Self-pay | Admitting: Family Medicine

## 2017-04-25 VITALS — BP 110/64 | HR 73 | Temp 97.7°F | Ht 66.0 in

## 2017-04-25 DIAGNOSIS — Z789 Other specified health status: Secondary | ICD-10-CM | POA: Diagnosis not present

## 2017-04-25 DIAGNOSIS — R52 Pain, unspecified: Secondary | ICD-10-CM | POA: Diagnosis not present

## 2017-04-25 DIAGNOSIS — R748 Abnormal levels of other serum enzymes: Secondary | ICD-10-CM

## 2017-04-25 DIAGNOSIS — Z7289 Other problems related to lifestyle: Secondary | ICD-10-CM

## 2017-04-25 DIAGNOSIS — J029 Acute pharyngitis, unspecified: Secondary | ICD-10-CM

## 2017-04-25 NOTE — Patient Instructions (Signed)
BEFORE YOU LEAVE: -lab visit in 2 weeks to check liver function -print out of neck xray report -check on parvo lab  I am so glad you are doing better!  Follow up with your ENT doctor if throat symptoms worsen or persist next week.  Follow up with your oncologist about the bone density and if symptoms recur. Also would advise rheumatology evaluation if  recurrent severe or diffuse joint pains.

## 2017-04-26 ENCOUNTER — Telehealth: Payer: Self-pay | Admitting: Family Medicine

## 2017-04-26 DIAGNOSIS — M2550 Pain in unspecified joint: Secondary | ICD-10-CM

## 2017-04-26 LAB — PARVOVIRUS B19 ANTIBODY, IGG AND IGM
Parvovirus B19 IgG: 6.8 — ABNORMAL HIGH (ref <0.9)
Parvovirus B19 IgM: 0.2 (ref <0.9)

## 2017-04-26 LAB — HEPATITIS PANEL, ACUTE
HEP A IGM: NONREACTIVE
HEP C AB: NONREACTIVE
Hep B C IgM: NONREACTIVE
Hepatitis B Surface Ag: NONREACTIVE
SIGNAL TO CUT-OFF: 0.02 (ref <1.00)

## 2017-04-26 NOTE — Telephone Encounter (Signed)
I spoke with pt and explained that Dr. Maudie Mercury is out of the office today, will be returning on Thursday. Pt requesting Prednisone for joint pain, said it was offered yesterday during visit but she declined at that time, wants to know if another provider can approve this? Also will need to forward note to Dr. Maudie Mercury for rheumatology referral, pt is now requesting. Please return patient call.

## 2017-04-26 NOTE — Telephone Encounter (Signed)
Copied from [place] [id number]. Topic: Referral - Request >> Apr 26, 2017 11:45 AM Carolyn Stare wrote:  Pt is moving to [facility] and since she can not see Dr Amil Amen until March she would like her referral to a doctor there  Dr Geryl Rankins [place]   [phone]

## 2017-04-26 NOTE — Telephone Encounter (Signed)
Copied from [place] [id number]. Topic: Referral - Request >> Apr 26, 2017  9:06 AM Corie Chiquito, NT wrote: Reason for CRM: Patient called again because she would like a referral to see a rheumatologist. She would like to go to [facility] to see a Dr.Beekman. If someone could give her a call back about this [phone]. Stated that she would like to be seen sooner rather than later

## 2017-04-26 NOTE — Telephone Encounter (Signed)
I spoke with pt and will forward note back to Dr. Maudie Mercury for review.

## 2017-04-26 NOTE — Telephone Encounter (Signed)
Per chart review, no mention of prednisone from yesterday's OFV.  I would advised discussing with pt's provider tomorrow when she is back in the office.

## 2017-04-26 NOTE — Telephone Encounter (Signed)
Copied from [place] [id number]. Topic: General - Other >> Apr 26, 2017  8:52 AM Lolita Rieger, RMA wrote: Reason for CRM: pt stated that she is still in pain and that it is worse at night pt stated that she would like to now try the prednisone that was offered at her visit Please contact pt @[phone]  sent to [facility] in [place]

## 2017-04-26 NOTE — Telephone Encounter (Signed)
This is second phone note, did send to Dr. Volanda Napoleon for review, Dr. Maudie Mercury not in office today.

## 2017-04-27 ENCOUNTER — Telehealth: Payer: Self-pay | Admitting: Family Medicine

## 2017-04-27 ENCOUNTER — Encounter: Payer: Self-pay | Admitting: Family Medicine

## 2017-04-27 NOTE — Telephone Encounter (Signed)
Please advise prednisone request.

## 2017-04-27 NOTE — Telephone Encounter (Signed)
Okay for rheum referral?

## 2017-04-27 NOTE — Telephone Encounter (Signed)
Yes. Polyarthralgia. See other note. Thanks.

## 2017-04-27 NOTE — Telephone Encounter (Signed)
Please place referral to rheumatology for polyarthralgias - urgent. She had told me she was better at the visit. Ok to do prednisone taper though she should run by her oncologist first and I do not advise using if symptoms are mild and improving. 40mg  x 2 days, 20 mg x 2 days, 10 mg x3 days.

## 2017-04-27 NOTE — Telephone Encounter (Signed)
Copied from [place]. Topic: Quick Communication - See Telephone Encounter >> Apr 27, 2017  9:25 AM Ahmed Prima L wrote: CRM for notification. See Telephone encounter for:   04/27/17.  Patient has questions about her lab results that came through on her mychart this morning. Call back is [phone] She rather have the Dr Maudie Mercury to call her than a nurse

## 2017-04-28 MED ORDER — PREDNISONE 10 MG PO TABS: ORAL_TABLET | ORAL | 0 refills | Status: DC

## 2017-04-28 NOTE — Telephone Encounter (Signed)
Pt called regarding message left by Hoyle Sauer at [facility]; conference call initiated with Brantley Fling explained that she did put in her rheumatology referral as urgent; pt is requesting Dr Rodney Langton in [place]; [place] give results of parvo; she also states that Dr Maudie Mercury does not recommend starting prednisone if the symptoms are mild; but the pt says the symptoms are not mild; Hoyle Sauer wll call in prednizone p3rscription; pt verbalizes understanding.

## 2017-04-28 NOTE — Telephone Encounter (Signed)
Referral placed as directed. Called patient and left message to return call to assess need for prednisone.

## 2017-04-28 NOTE — Telephone Encounter (Signed)
Called patient and left message to return call. MyChart message sent to patient by PCP. Will close this note as there are multiple other notes in chart pertaining to same issue.

## 2017-04-28 NOTE — Telephone Encounter (Signed)
Spoke with patient, she reports no overall improvement in symptoms and is still in severe pain especially in the evenings. She does sometimes feels slightly better during the daytime, but feels she could benefit from pred taper. Medication filled to pharmacy as requested.

## 2017-04-28 NOTE — Telephone Encounter (Signed)
Referral placed as directed. 

## 2017-05-01 ENCOUNTER — Telehealth: Payer: Self-pay | Admitting: Family Medicine

## 2017-05-01 NOTE — Telephone Encounter (Signed)
I believe Bethany Gardner started with local rheum offices and only contacted the [place] office once he was sure no one locally could see her sooner. Bethany Gardner, can you verify?

## 2017-05-01 NOTE — Telephone Encounter (Signed)
Bethany Gardner,  Can you see if there is any rheum office we can get here with sooner? Ok to send referral to [facility] per request. Please schedule appt if persistent issues. She was doing better the last time I saw her. Thanks.

## 2017-05-01 NOTE — Telephone Encounter (Signed)
I believe we came to the conclusion it needed to go to [place] due to time constraints.

## 2017-05-01 NOTE — Telephone Encounter (Signed)
Copied from [place] [phone]. Topic: Quick Communication - See Telephone Encounter >> May 01, 2017 11:55 AM Boyd Kerbs wrote: CRM for notification. See Telephone encounter for:   Patient called regarding referral to Rheumatologist in [place]  Dr. Haynes Kerns  [phone], they are saying our office here needs to call and set up an appt. for her. They did tell pt. they can not get her in until end of Feb.   She is requesting to see if there is a doctor here in [place] that she can see before she moves as she is in a lot a pain. Wanting to see if can get into someone asap.   She is wanting to let doctor know that the Prednisone has worked but out in two days. (has 7 day pack) band worried about when she is out in 2 days.   Please call patient   05/01/17.

## 2017-05-02 NOTE — Telephone Encounter (Addendum)
Patient is moving to [facility] Feb. 17. She declines sooner follow-up w/ PCP at this time.   Please proceed w/ referral to Dr. Koren Bound in [place] if unable to get pt in with local rheumatologist before she moves. Per patient, we must call Dr. Joesphine Bare office to arrange appointment and cannot just fax notes.

## 2017-05-03 NOTE — Telephone Encounter (Signed)
Pt aware Dr Estanislado Pandy has ok'd for her to be seen in her office. Pt is going to call for appt.  Pt needs you to proceed with this referral to  Dr Koren Bound  asap.  Pt states Dr Koren Bound is requiring a phone call from the office for the referral. It cannot be faxed only.  Thank you.  Does Dr Maudie Mercury need to put this in?  Pt states she called over a week ago and this has not happened yet.

## 2017-05-03 NOTE — Progress Notes (Signed)
HPI:   Follow up polyarthralgia: -started last month -saw oncologist, then me -felt to possibly viral related or drug related vs other -reported doing much better each time she saw me -we imaged her most painful joints and she has had a number of labs (ANA, sed rate, ca 25.29, cmp, cbc with diff, b12, vit D, parvo b19 and hep panel) -only sig abnormality was mildly elevated LFTs - after drinking alcohol -symptoms recurred after last visit and she wanted to try prednisone in case PMR  -she has been referred to rheumatologist, she initially declined as is moving to [place] in a few weeks and symptoms had resolved -today reports: The day after she saw me when she was feeling symptoms had resolved, she then developed sudden pain in the left shoulder and arm that was excruciating.  Called here requesting prednisone which she started this and pain resolved within 24 hours.  However,  several days later, she reports she had swelling and pain in the R hand that was severe. She reports a nodule on tendon L thumb which she thinks started with this illness. She took aleve which resolved the symptoms. Now feels again like symptoms are resolved.  -denies fevers, rash, malaise, illness -she has appt with rheumatologist in [place] for when she moves, she wants prednisone to take as needed in the interim -mother with fibromyalgia ROS: See pertinent positives and negatives per HPI.  Past Medical History:  Diagnosis Date  . Anemia    father has Factor V Leiden-no issues with it. Pt has not been tested  . Endometriosis   . Fibroid   . Foot pain    bilateral - occ foot pain  . Infertility, female   . 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 Obesity   . Personal history of radiation therapy 2018    Past Surgical History:  Procedure Laterality Date  . ABDOMINAL HYSTERECTOMY    . BREAST BIOPSY Left    benign  . BREAST LUMPECTOMY Left  2018  . BREAST LUMPECTOMY WITH RADIOACTIVE SEED AND SENTINEL LYMPH NODE BIOPSY Left 04/15/2016   Procedure: BREAST LUMPECTOMY WITH RADIOACTIVE SEED AND SENTINEL LYMPH NODE BIOPSY;  Surgeon: Autumn Messing III, MD;  Location: [facility];  Service: General;  Laterality: Left;  BREAST LUMPECTOMY WITH RADIOACTIVE SEED AND SENTINEL LYMPH NODE BIOPSY  . DILATION AND CURETTAGE OF UTERUS    . laproscopy     three  . TONSILLECTOMY      Family History  Problem Relation Age of Onset  . Heart disease Father   . Heart attack Father 45  . Transient ischemic attack Mother   . Stroke Mother   . Breast cancer Maternal Grandmother 60  . Melanoma Sister 55  . Scleroderma Paternal Uncle 55  . Heart attack Paternal Grandfather 59  . Colon cancer Neg Hx   . Colon polyps Neg Hx   . Esophageal cancer Neg Hx   . Rectal cancer Neg Hx   . Stomach cancer Neg Hx     Social History   Socioeconomic History  . Marital status: Married    Spouse name: None  . Number of children: None  . Years of education: None  . Highest education level: None  Social Needs  . Financial resource strain: None  . Food insecurity - worry: None  . Food insecurity - inability: None  . Transportation needs - medical: None  . Transportation needs -  non-medical: None  Occupational History  . None  Tobacco Use  . Smoking status: Never Smoker  . Smokeless tobacco: Never Used  Substance and Sexual Activity  . Alcohol use: No  . Drug use: No  . Sexual activity: Yes    Partners: Male    Birth control/protection: Surgical    Comment: Hysterectomy  Other Topics Concern  . None  Social History Narrative   Work or School: homemaker      Home Situation: lives with husband and son in [place] (also has a daughter)      Spiritual Beliefs:  Christian      Lifestyle: restarting a healthy diet and exercise in 2016        Current Outpatient Medications:  .  ibuprofen (ADVIL,MOTRIN) 600 MG tablet, Take 1 tablet  (600 mg total) by mouth every 6 (six) hours as needed., Disp: 30 tablet, Rfl: 0 .  predniSONE (DELTASONE) 10 MG tablet, 20-40mg  daily for 3-5 days as needed., Disp: 30 tablet, Rfl: 0 No current facility-administered medications for this visit.   Facility-Administered Medications Ordered in Other Visits:  .  dextrose 5 % and 0.9 % NaCl with KCl 20 mEq/L infusion, , Intravenous, Continuous, Autumn Messing III, MD .  heparin injection 5,000 Units, 5,000 Units, Subcutaneous, Q8H, Autumn Messing III, MD .  HYDROcodone-acetaminophen (NORCO/VICODIN) 5-325 MG per tablet 1-2 tablet, 1-2 tablet, Oral, Q4H PRN, Autumn Messing III, MD .  ondansetron (ZOFRAN-ODT) disintegrating tablet 4 mg, 4 mg, Oral, Q6H PRN **OR** ondansetron (ZOFRAN) 4 mg in sodium chloride 0.9 % 50 mL IVPB, 4 mg, Intravenous, Q6H PRN, Autumn Messing III, MD .  pantoprazole (PROTONIX) injection 40 mg, 40 mg, Intravenous, QHS, Autumn Messing III, MD  EXAM:  Vitals:   05/04/17 1522  BP: 112/60  Pulse: 79  Temp: 98.2 F (36.8 C)    Body mass index is 31.23 kg/m.  GENERAL: vitals reviewed and listed above, alert, oriented, appears well hydrated and in no acute distress  HEENT: atraumatic, conjunttiva clear, no obvious abnormalities on inspection of external nose and ears  NECK: no obvious masses on inspection  MS: moves all extremities without noticeable abnormality, normal appearance joints in question today. Mild dry skin. Small mobile nodule that seems to be on tendon L thumb.  PSYCH: pleasant and cooperative, no obvious depression or anxiety  ASSESSMENT AND PLAN:  Discussed the following assessment and plan:  Polyarthralgia - Plan: Hepatic function panel, C-reactive Protein, Sedimentation Rate, Rheumatoid Factor, Cyclic citrul peptide antibody, IgG, Uric Acid  -we discussed possible etiologies, workup and treatment, treatment risks and return precautions  - we have referred her to rheum for further eval, offered sports med or [place] eval the  nodule on the hand - she declined (this feels like tenosynovitis), labs per orders -after this discussion, Bethany Gardner opted for: labs per orders, aleve as needed, prednisone if any further severe symptoms pending rheum eval and ER if severe symptoms that do not respond to treatment or other symptoms with severe symptoms -of course, we advised Bethany Gardner  to return or notify a doctor immediately if symptoms worsen or persist or new concerns arise.  Patient Instructions  BEFORE YOU LEAVE: -labs  See the rheumatologist as planned.  In the interim can try aleve or prednisone if you have any more flares of pain.  Seek emergency care if severe pain, severe symptoms or symptoms not responding to treatment.  We have ordered labs or studies at this visit. It can take up to 1-2 weeks  for results and processing. IF results require follow up or explanation, we will call you with instructions. Clinically stable results will be released to your [facility]. If you have not heard from [place] or cannot find your results in [facility] in 2 weeks please contact our office at [phone].  If you are not yet signed up for [facility], please consider signing up.           Bethany Kern, DO

## 2017-05-04 ENCOUNTER — Encounter: Payer: Self-pay | Admitting: Family Medicine

## 2017-05-04 ENCOUNTER — Ambulatory Visit (INDEPENDENT_AMBULATORY_CARE_PROVIDER_SITE_OTHER): Payer: Managed Care, Other (non HMO) | Admitting: Family Medicine

## 2017-05-04 VITALS — BP 112/60 | HR 79 | Temp 98.2°F | Ht 66.0 in

## 2017-05-04 DIAGNOSIS — M2550 Pain in unspecified joint: Secondary | ICD-10-CM | POA: Diagnosis not present

## 2017-05-04 MED ORDER — PREDNISONE 10 MG PO TABS: ORAL_TABLET | ORAL | 0 refills | Status: AC

## 2017-05-04 NOTE — Telephone Encounter (Signed)
What is the status of referral to Dr. Koren Bound? Patient states we must call his office and cannot just fax referral.  I spoke with patient 2 days ago and notified her that the referral process can take up to 1 week.

## 2017-05-04 NOTE — Telephone Encounter (Signed)
Bethany Gardner,  I receive this message this morning, was not in my basket last night. I don't quite understand this request. I thought this was all done. Please place call if needed for the referral. Please let pt know. Thanks.

## 2017-05-04 NOTE — Patient Instructions (Addendum)
BEFORE YOU LEAVE: -labs  See the rheumatologist as planned.  In the interim can try aleve or prednisone if you have any more flares of pain.   Seek emergency care if severe pain, severe symptoms or symptoms not responding to treatment.  We have ordered labs or studies at this visit. It can take up to 1-2 weeks for results and processing. IF results require follow up or explanation, we will call you with instructions. Clinically stable results will be released to your [facility]. If you have not heard from [place] or cannot find your results in [facility] in 2 weeks please contact our office at [phone].  If you are not yet signed up for [facility], please consider signing up.

## 2017-05-04 NOTE — Progress Notes (Signed)
Patient declined weight measurement today.

## 2017-05-04 NOTE — Telephone Encounter (Addendum)
Called patient, notified her that we have not been able to get her in with local rheumatologist prior to Feb. 17 and that we are proceeding with referral to Dr. Koren Bound. She is frustrated because she states she called about this 2 weeks ago and that she has called "everyday" since then and spent "45 minutes on the phone each day" with someone about this and that "nothing has been done."   Chart does not reflect this, and I reviewed this with her. Her initial call was on 04/26/17. Urgent rheum referral was placed on 04/28/17. Referral coordinator at [place] worked on this referral on 04/28/17 while [employer] coordinator was out of office, and was unable to find an appointment locally. Patient was contacted on 05/03/17 and offered appt w/ Dr. Estanislado Pandy, but declined because it was after her move date. Spoke w/ our referral coordinator this morning and she will proceed with referral to Dr. Koren Bound since we have been unable to get patient in with a local rheumatologist prior to her move date. She is also frustrated that we were not simultaneously working on both referrals locally and in [place]. I explained that the local referral has been a priority, since it is more time sensitive, but that we are now proceeding with the referral to specialist in [place]. She reports she has been "incapacitated" the last few days due to her symptoms of joint pain. On the phone, she alert and oriented and speech is clear.  Patient is coming in for an appointment this afternoon, and we will update her at that time about the latest status of her referral.   Discussed w/ Dr. Maudie Mercury who recommended offering patient option of ED eval if truly "incapacitated" and 10/10 pain. I called patient back and discussed recommendation. Patient responded "I'm not going to do that. I don't want to do that." States she was hospitalized in October 2018 (chart reflects August 2018 admission) and she does not want to do that again. She  will come in today for appointment as scheduled.

## 2017-05-05 LAB — RHEUMATOID FACTOR

## 2017-05-05 LAB — HEPATIC FUNCTION PANEL
ALBUMIN: 4.3 g/dL (ref 3.5–5.2)
ALT: 50 U/L — ABNORMAL HIGH (ref 0–35)
AST: 29 U/L (ref 0–37)
Alkaline Phosphatase: 87 U/L (ref 39–117)
Bilirubin, Direct: 0.1 mg/dL (ref 0.0–0.3)
TOTAL PROTEIN: 7.3 g/dL (ref 6.0–8.3)
Total Bilirubin: 0.3 mg/dL (ref 0.2–1.2)

## 2017-05-05 LAB — URIC ACID: Uric Acid, Serum: 4.2 mg/dL (ref 2.4–7.0)

## 2017-05-05 LAB — C-REACTIVE PROTEIN: CRP: 0.2 mg/dL — ABNORMAL LOW (ref 0.5–20.0)

## 2017-05-05 LAB — CYCLIC CITRUL PEPTIDE ANTIBODY, IGG: Cyclic Citrullin Peptide Ab: <16 UNITS

## 2017-05-05 LAB — SEDIMENTATION RATE: Sed Rate: 17 mm/hr (ref 0–30)

## 2017-05-22 ENCOUNTER — Ambulatory Visit: Payer: Managed Care, Other (non HMO) | Admitting: Family Medicine

## 2017-06-07 NOTE — Telephone Encounter (Signed)
No entry 

## 2017-06-23 ENCOUNTER — Other Ambulatory Visit: Payer: Self-pay | Admitting: Internal Medicine

## 2017-06-23 DIAGNOSIS — E042 Nontoxic multinodular goiter: Secondary | ICD-10-CM

## 2017-06-23 DIAGNOSIS — R1310 Dysphagia, unspecified: Secondary | ICD-10-CM

## 2017-06-26 ENCOUNTER — Ambulatory Visit
Admission: RE | Admit: 2017-06-26 | Discharge: 2017-06-26 | Disposition: A | Discharge status: Home / Self Care | Payer: 59 | Source: Ambulatory Visit | Attending: Internal Medicine | Admitting: Internal Medicine

## 2017-06-26 DIAGNOSIS — R1310 Dysphagia, unspecified: Secondary | ICD-10-CM

## 2017-06-26 DIAGNOSIS — E042 Nontoxic multinodular goiter: Secondary | ICD-10-CM

## 2017-08-09 ENCOUNTER — Other Ambulatory Visit: Payer: Self-pay

## 2017-08-09 DIAGNOSIS — D0512 Intraductal carcinoma in situ of left breast: Secondary | ICD-10-CM

## 2017-08-09 DIAGNOSIS — C50312 Malignant neoplasm of lower-inner quadrant of left female breast: Secondary | ICD-10-CM

## 2017-08-09 DIAGNOSIS — Z170 Estrogen receptor positive status [ER+]: Secondary | ICD-10-CM

## 2017-08-09 NOTE — Progress Notes (Signed)
Received Vm from pt in regards she has moved to [facility] and would like to be referred to Dr Tery Sanfilippo and would like her records from Dr Jana Hakim faxed to [phone].  Notified Wilber Bihari NP as she saw pt last in January 2019 and received referral from her at this time.  Will call pt and verify physician as Dr Meredith Staggers office and contact Dr Rennis Harding office to verify fax number and fax her most recent office visit, bone scan, mammogram, and demographic sheet to their office.

## 2017-09-07 ENCOUNTER — Telehealth: Payer: Self-pay | Admitting: *Deleted

## 2017-09-07 ENCOUNTER — Telehealth: Payer: Self-pay | Admitting: Oncology

## 2017-09-07 NOTE — Telephone Encounter (Signed)
Crysta @ Dr. Tery Sanfilippo called requesting all records to be faxed to her office.  Called forwarded to Ramia, lead HIM. Crysta's     Phone     [phone]      ;      Fax       [phone].

## 2017-09-07 NOTE — Telephone Encounter (Signed)
Faxed medical records to Dr. Carmel [place] Mink [phone] on 09/07/17, Release ID: [id number]

## 2017-09-08 ENCOUNTER — Telehealth: Payer: Self-pay | Admitting: *Deleted

## 2017-09-08 NOTE — Telephone Encounter (Signed)
Faxed ROI to [facility]; release [phone]

## 2017-11-29 ENCOUNTER — Ambulatory Visit: Payer: Managed Care, Other (non HMO) | Admitting: Oncology

## 2017-12-07 ENCOUNTER — Ambulatory Visit: Payer: Managed Care, Other (non HMO) | Admitting: Adult Health

## 2018-06-12 IMAGING — CT CT ANGIO NECK
2 of 4 series · 6 of 32 positions shown, 11 images · IV contrast (APPLIED)
Comparison: None.

CLINICAL DATA: Pulsatile tinnitus right ear. History of breast
cancer

EXAM:
CT ANGIOGRAPHY NECK
TECHNIQUE: Multidetector CT imaging of the neck was performed using the
standard protocol during bolus administration of intravenous
contrast. Multiplanar CT image reconstructions and MIPs were
obtained to evaluate the vascular anatomy. Carotid stenosis
measurements (when applicable) are obtained utilizing NASCET
criteria, using the distal internal carotid diameter as the
denominator.
CONTRAST:  80 mL Isovue 370 IV

[Series 6: carotid angio · axial · 0.39mm/px · z∈[-314,-149]mm · 4 of 93 slices shown, 9 images]
[im 19/93  soft-tissue]
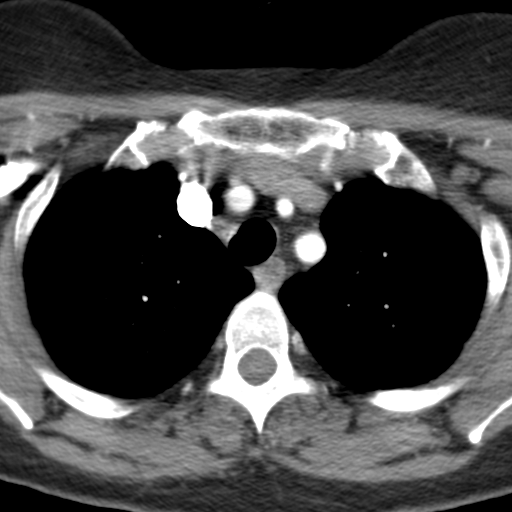
[im 19/93  lung]
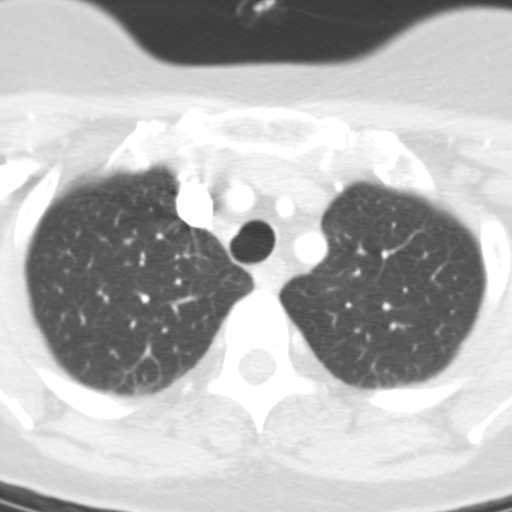
[im 19/93  bone]
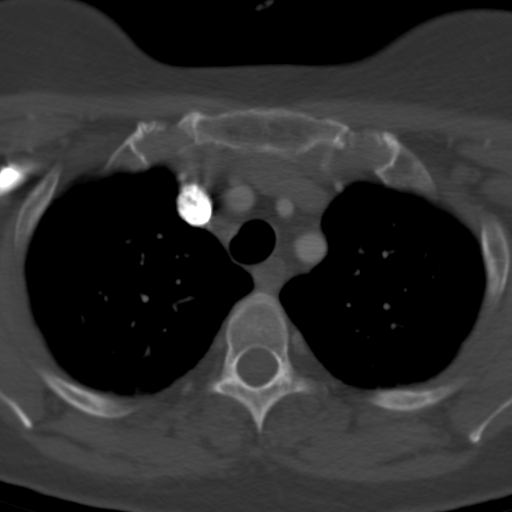
[im 37/93  soft-tissue]
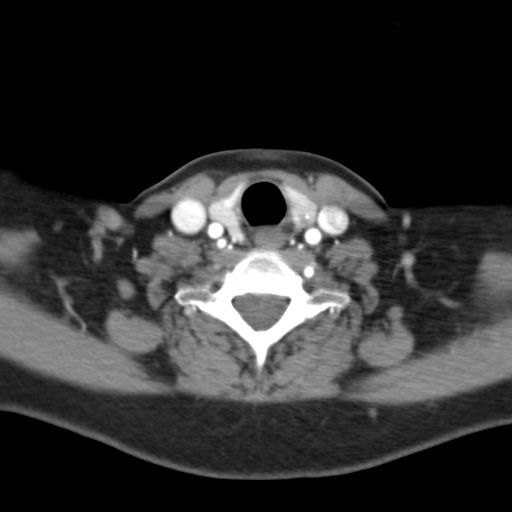
[im 37/93  lung]
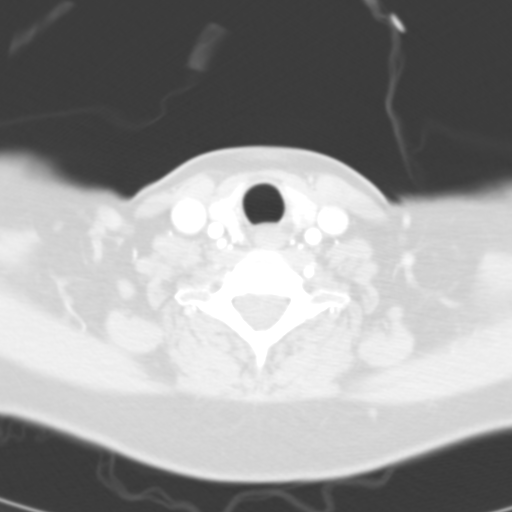
[im 56/93  soft-tissue]
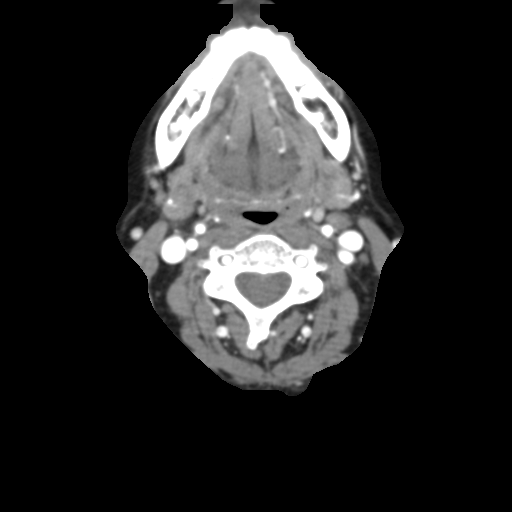
[im 56/93  lung]
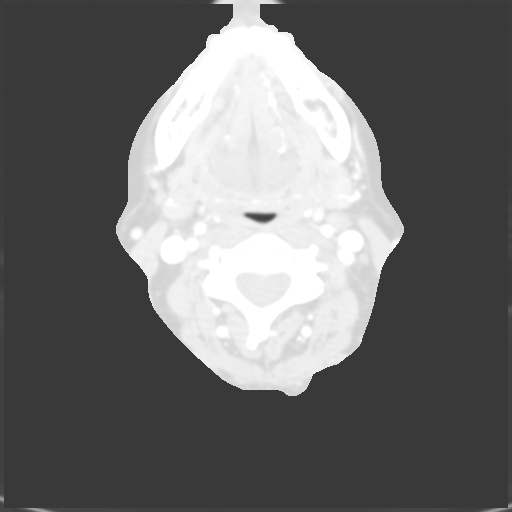
[im 74/93  soft-tissue]
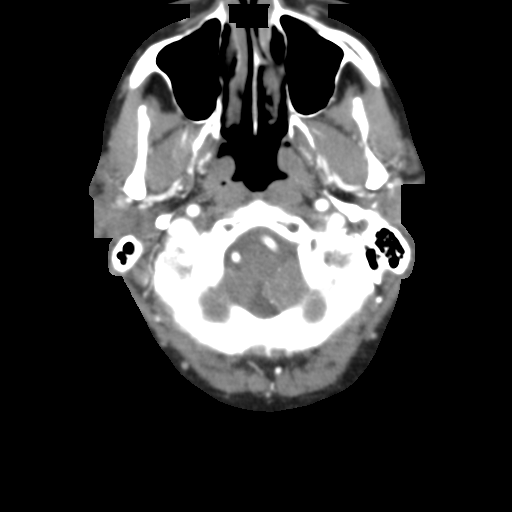
[im 74/93  lung]
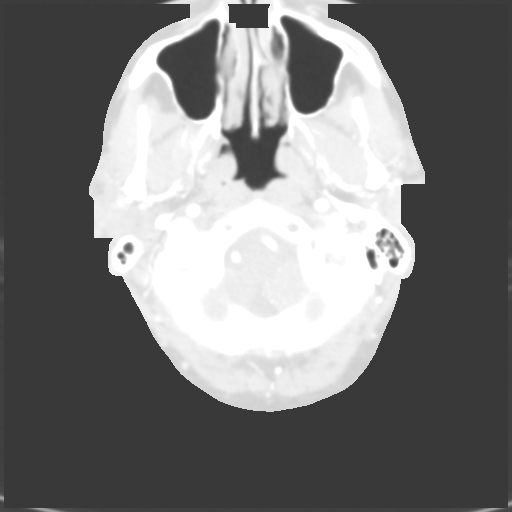

[Series 13: axial thick · axial · 0.36mm/px · z∈[-273,-188]mm · 2 of 53 slices shown]
[im 18/53  soft-tissue]
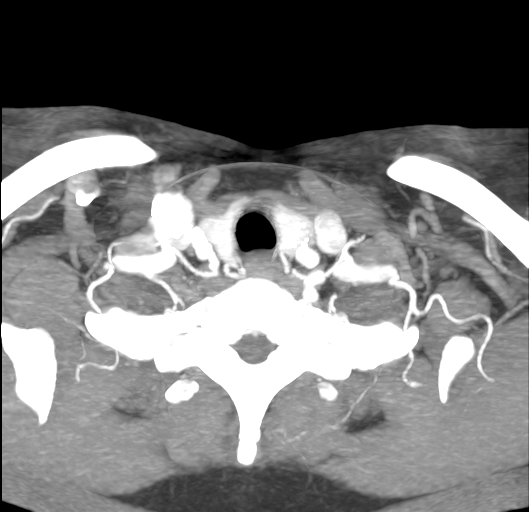
[im 35/53  soft-tissue]
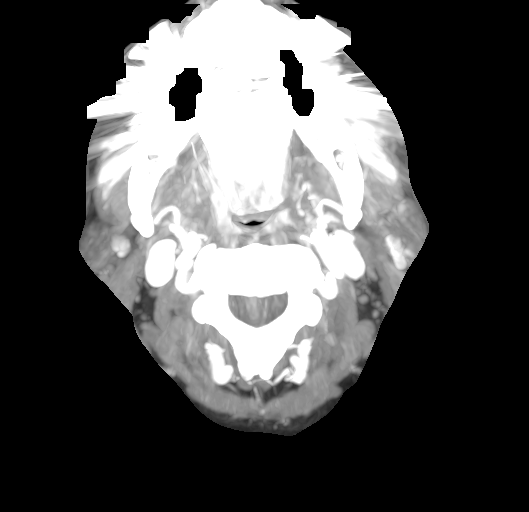

[6 of 32 positions shown; findings below may reference images not displayed]

FINDINGS: Aortic arch: Normal

Right carotid system: Normal

Left carotid system: Normal

Vertebral arteries: Both vertebral arteries appear normal and
contribute to the basilar. Negative for dissection.

Skeleton: Negative. Mastoid sinus clear bilaterally. Paranasal
sinuses clear. Normal appearing skullbase.

Other neck: Left thyroid nodule measures 8 mm with small adjacent
calcifications. Right thyroid lobe normal.

Upper chest: Negative
IMPRESSION: Negative CTA of the neck. Bilateral carotid artery and vertebral
artery is normal. No dissection stenosis or aneurysm identified.

8 mm left thyroid nodule with adjacent calcifications. Thyroid
ultrasound recommended for further evaluation.

## 2020-05-25 ENCOUNTER — Encounter: Payer: Self-pay | Admitting: Gastroenterology
